# Patient Record
Sex: Female | Born: 1990 | Race: White | Hispanic: No | Marital: Married | Smoking: Never smoker
Health system: Southern US, Community
[De-identification: ages and names within clinical notes are randomized; demographics above are authoritative.]

## PROBLEM LIST (undated history)

## (undated) DIAGNOSIS — N898 Other specified noninflammatory disorders of vagina: Secondary | ICD-10-CM

## (undated) DIAGNOSIS — B379 Candidiasis, unspecified: Secondary | ICD-10-CM

## (undated) DIAGNOSIS — N63 Unspecified lump in unspecified breast: Secondary | ICD-10-CM

## (undated) DIAGNOSIS — R42 Dizziness and giddiness: Secondary | ICD-10-CM

## (undated) DIAGNOSIS — F329 Major depressive disorder, single episode, unspecified: Secondary | ICD-10-CM

## (undated) DIAGNOSIS — F32A Depression, unspecified: Secondary | ICD-10-CM

## (undated) DIAGNOSIS — Z309 Encounter for contraceptive management, unspecified: Secondary | ICD-10-CM

## (undated) DIAGNOSIS — R000 Tachycardia, unspecified: Secondary | ICD-10-CM

## (undated) HISTORY — DX: Unspecified lump in unspecified breast: N63.0

## (undated) HISTORY — DX: Dizziness and giddiness: R42

## (undated) HISTORY — DX: Major depressive disorder, single episode, unspecified: F32.9

## (undated) HISTORY — DX: Depression, unspecified: F32.A

## (undated) HISTORY — DX: Tachycardia, unspecified: R00.0

## (undated) HISTORY — DX: Encounter for contraceptive management, unspecified: Z30.9

## (undated) HISTORY — PX: WISDOM TOOTH EXTRACTION: SHX21

## (undated) HISTORY — PX: TONSILLECTOMY AND ADENOIDECTOMY: SHX28

## (undated) HISTORY — DX: Candidiasis, unspecified: B37.9

## (undated) HISTORY — DX: Other specified noninflammatory disorders of vagina: N89.8

---
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
## 2013-06-25 ENCOUNTER — Encounter: Payer: Self-pay | Admitting: Women's Health

## 2013-06-25 ENCOUNTER — Encounter (INDEPENDENT_AMBULATORY_CARE_PROVIDER_SITE_OTHER): Payer: Self-pay

## 2013-06-25 ENCOUNTER — Ambulatory Visit (INDEPENDENT_AMBULATORY_CARE_PROVIDER_SITE_OTHER): Payer: BC Managed Care – PPO | Admitting: Women's Health

## 2013-06-25 VITALS — BP 102/80 | Ht 62.0 in | Wt 108.0 lb

## 2013-06-25 DIAGNOSIS — N926 Irregular menstruation, unspecified: Secondary | ICD-10-CM

## 2013-06-25 NOTE — Patient Instructions (Signed)

## 2013-06-26 LAB — GC/CHLAMYDIA PROBE AMP
CT Probe RNA: NEGATIVE
GC Probe RNA: NEGATIVE

## 2013-06-26 NOTE — Progress Notes (Signed)
Patient ID: GLENDELL SCHLOTTMAN, female   DOB: 01-Dec-1990, 22 y.o.   MRN: [id number] Gina Galvan is a 22 y.o. G0 Caucasian female here w/ report of irregular bleeding and cramping on COCs. She reports being on continuous regimen Sprintec x 46yr d/t heavy periods, and occasionally will have some BTB, so she will stop pills for few days, then start back.  However, last month she had period x 3 weeks, so she again came off of pills for a few days, bleeding stopped, she got back on pills, and bleeding began again.  She uses condoms for secondary contraception and STI prevention.  Denies h/o thyroid problems.  She has never had a pap smear, but has appt scheduled for 11/22.   O: BP 102/80  Ht 5\' 2"  (1.575 m)  Wt 108 lb (48.988 kg)  BMI 19.75 kg/m2  LMP 06/22/2013     Vulva is normal without lesions    Vagina is pink moist without discharge    Cervix normal in appearance, no polyps    Uterus is normal size shape and contour    Adnexa is negative with normal sized ovaries    A: DUB on continuous regimen COCs     Needs pap    P: TSH, GC/CH today      Discussed w/ JAG     To begin regular regimen COCs/take placebo pills to see if this will help w/ DUB     Continue condoms for STI prevention     F/U as scheduled on 11/22 for pap/physical  Cheral Marker, CNM, [facility] 06/26/2013 9:40 AM

## 2013-07-04 ENCOUNTER — Other Ambulatory Visit: Payer: Self-pay

## 2013-07-19 ENCOUNTER — Encounter: Payer: Self-pay | Admitting: Adult Health

## 2013-07-19 ENCOUNTER — Other Ambulatory Visit (HOSPITAL_COMMUNITY)
Admission: RE | Admit: 2013-07-19 | Discharge: 2013-07-19 | Disposition: A | Discharge status: Home / Self Care | Payer: BC Managed Care – PPO | Source: Ambulatory Visit | Attending: Obstetrics and Gynecology | Admitting: Obstetrics and Gynecology

## 2013-07-19 ENCOUNTER — Ambulatory Visit (INDEPENDENT_AMBULATORY_CARE_PROVIDER_SITE_OTHER): Payer: BC Managed Care – PPO | Admitting: Adult Health

## 2013-07-19 VITALS — BP 110/60 | HR 76 | Ht 61.5 in | Wt 106.5 lb

## 2013-07-19 DIAGNOSIS — Z01419 Encounter for gynecological examination (general) (routine) without abnormal findings: Secondary | ICD-10-CM

## 2013-07-19 DIAGNOSIS — N63 Unspecified lump in unspecified breast: Secondary | ICD-10-CM

## 2013-07-19 DIAGNOSIS — Z3041 Encounter for surveillance of contraceptive pills: Secondary | ICD-10-CM | POA: Insufficient documentation

## 2013-07-19 DIAGNOSIS — Z309 Encounter for contraceptive management, unspecified: Secondary | ICD-10-CM

## 2013-07-19 HISTORY — DX: Unspecified lump in unspecified breast: N63.0

## 2013-07-19 HISTORY — DX: Encounter for contraceptive management, unspecified: Z30.9

## 2013-07-19 MED ORDER — NORGESTIMATE-ETH ESTRADIOL 0.25-35 MG-MCG PO TABS: 1.0000 | ORAL_TABLET | Freq: Every day | ORAL | Status: DC

## 2013-07-19 NOTE — Progress Notes (Signed)
Patient ID: Gina Galvan, female   DOB: Nov 07, 1990, 22 y.o.   MRN: [id number] History of Present Illness: Gina Galvan is a 22 year old white female, single, in for first pap and physical.Has noticed nodule left breast x 1 month, it is mobile and non tender.   Current Medications, Allergies, Past Medical History, Past Surgical History, Family History and Social History were reviewed in [employer] record.     Review of Systems: Patient denies any headaches, blurred vision, shortness of breath, chest pain, abdominal pain, problems with bowel movements, urination, or intercourse. No joint pain or mod swings, in nursing school.Had some bleeding issues but they have resolved,See HPI.    Physical Exam:BP 110/60  Pulse 76  Ht 5' 1.5" (1.562 m)  Wt 106 lb 8 oz (48.308 kg)  BMI 19.80 kg/m2  LMP 06/22/2013 General:  Well developed, well nourished, no acute distress Skin:  Warm and dry Neck:  Midline trachea, normal thyroid Lungs; Clear to auscultation bilaterally Breast:  No dominant palpable mass, retraction, or nipple discharge on right on left there is a mobile round nodule at 4 0' clock 2 FB from nipple, non tender, no retraction or nipple discharge.?fibroadenoma vs cyst Cardiovascular: Regular rate and rhythm Abdomen:  Soft, non tender, no hepatosplenomegaly Pelvic:  External genitalia is normal in appearance.  The vagina is normal in appearance.               The cervix is everted at os and pap performed with thin prep.  Uterus is felt to be normal size, shape, and contour.  No  adnexal masses or tenderness noted. Extremities:  No swelling or varicosities noted Psych:  No mood changes, alert and cooperative seems happy   Impression: Yearly gyn exam Contraceptive management Left breast nodule    Plan: [place] left breast 11/26 at 9 am at [facility] Physical in 1 year, pap in 2 years Refilled sprintec x 1 year

## 2013-07-19 NOTE — Patient Instructions (Signed)
Physical in 1 year Get left breast US 11/26 at 9 am at [facility]

## 2013-07-24 ENCOUNTER — Ambulatory Visit (HOSPITAL_COMMUNITY)
Admission: RE | Admit: 2013-07-24 | Discharge: 2013-07-24 | Disposition: A | Discharge status: Home / Self Care | Payer: BC Managed Care – PPO | Source: Ambulatory Visit | Attending: Adult Health | Admitting: Adult Health

## 2013-07-24 ENCOUNTER — Ambulatory Visit (HOSPITAL_COMMUNITY): Payer: BC Managed Care – PPO

## 2013-07-24 DIAGNOSIS — N63 Unspecified lump in unspecified breast: Secondary | ICD-10-CM

## 2014-02-23 IMAGING — US US BREAST*L*
1 series · 4 of 4 positions shown · non-contrast
Comparison: Nine

CLINICAL DATA: Patient with palpable lump left breast.

EXAM:
ULTRASOUND LEFT BREAST

[Series 1: us breast*left* · 0.06mm/px · 4 of 4 slices shown]
[im 1/4]
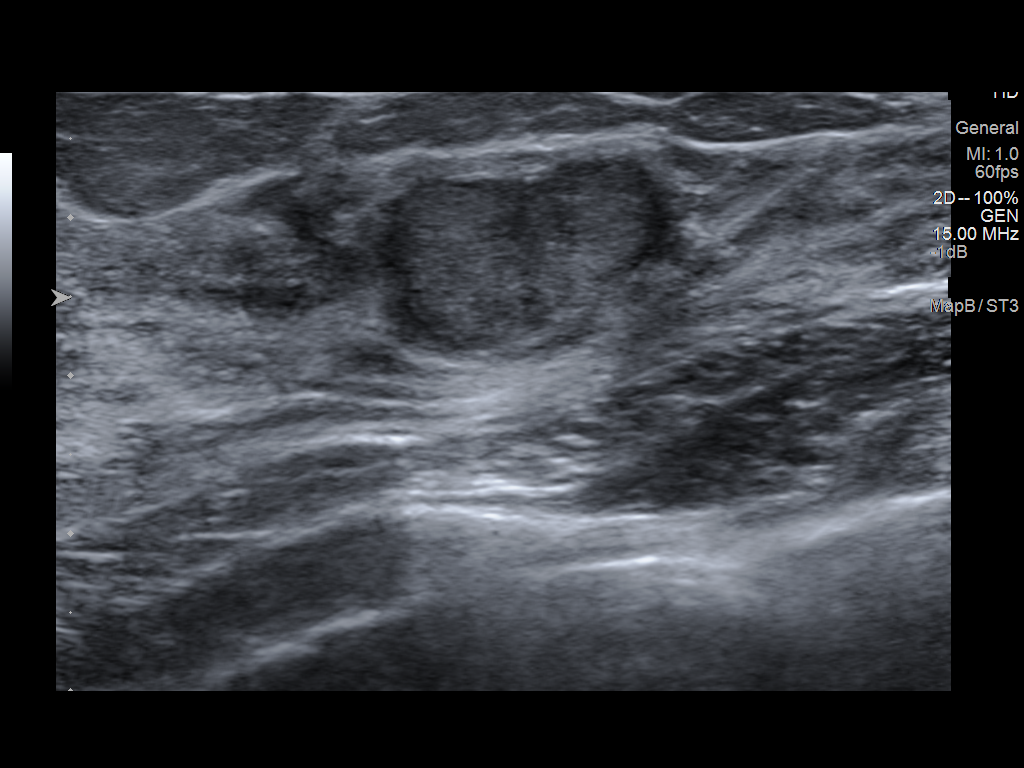
[im 2/4]
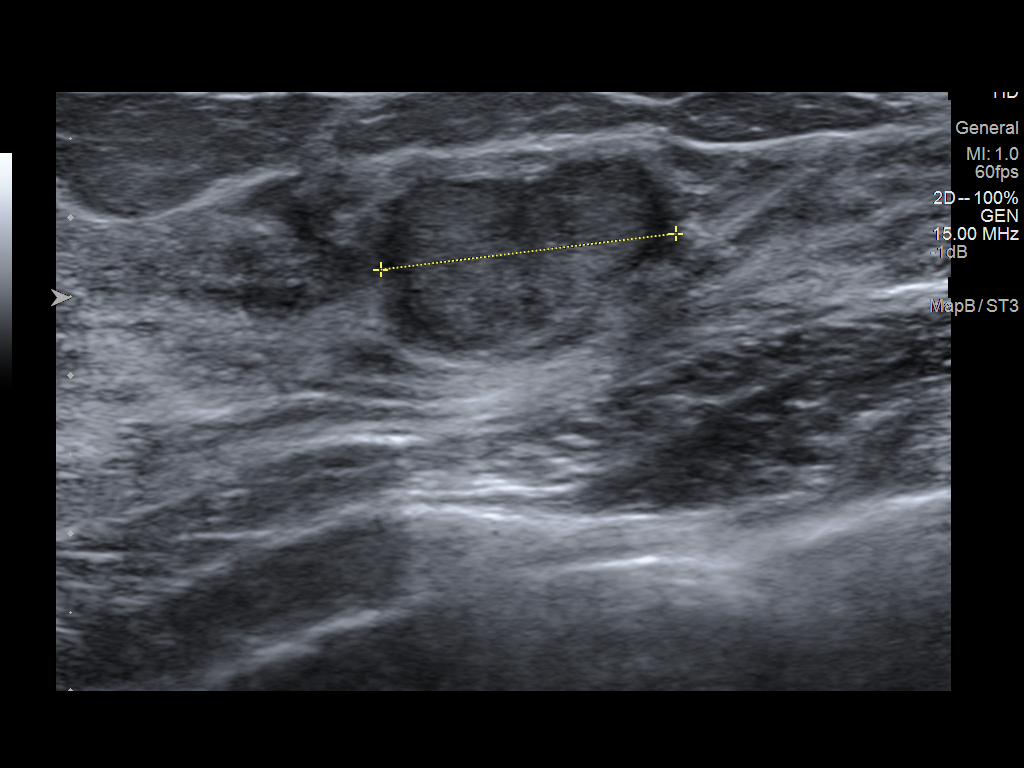
[im 3/4]
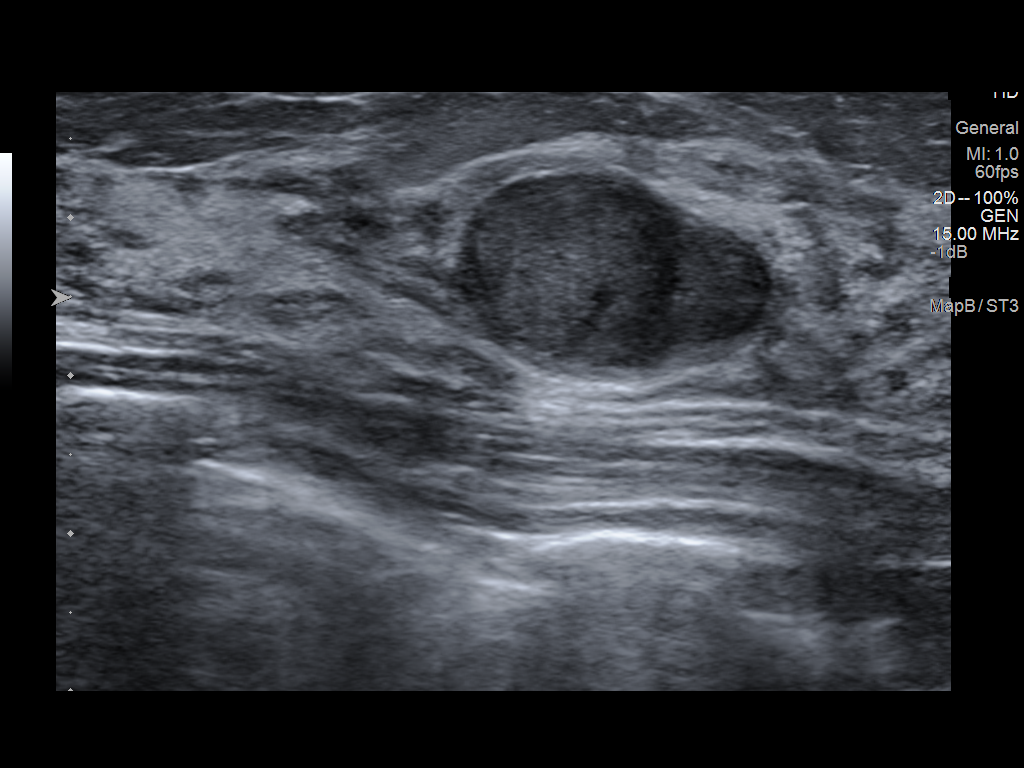
[im 4/4]
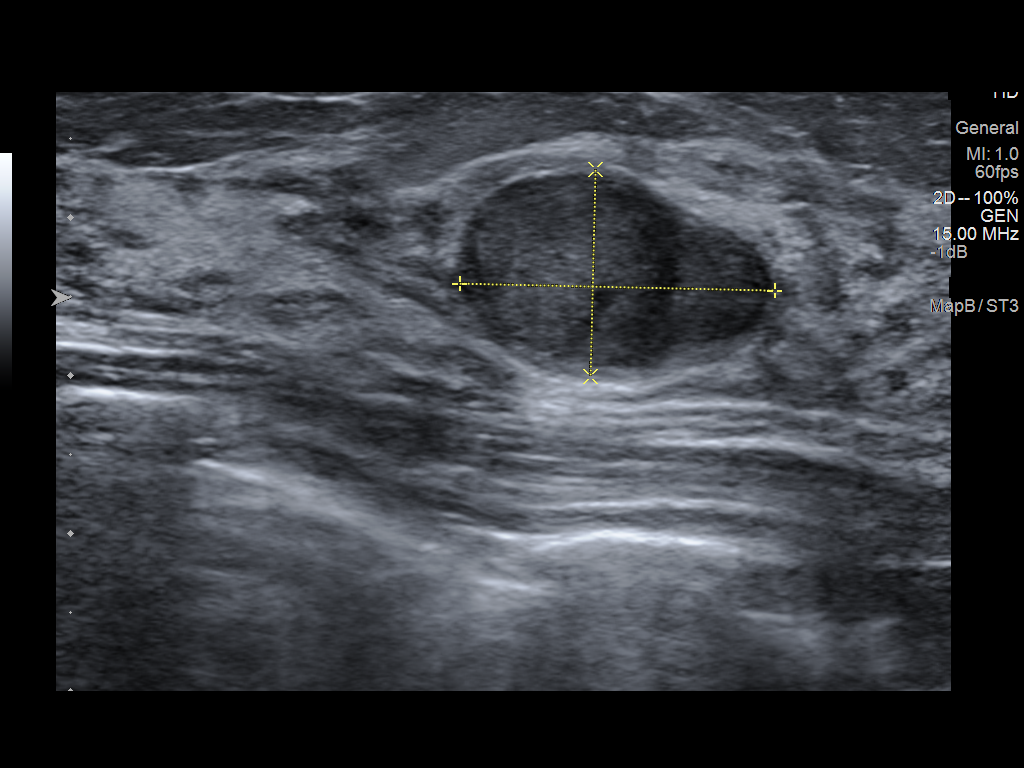

[4 of 4 positions shown; findings below may reference images not displayed]

FINDINGS: On physical exam,I palpate a mobile mass within the lateral left
breast..

Ultrasound is performed, showing a 2.0 x 1.3 x 1.9 cm oval
circumscribed hypoechoic mass within the left breast 3 o'clock
position 2 cm from the nipple at the site of palpable abnormality..
IMPRESSION: Probably benign hypoechoic mass within the left breast at the site
of palpable abnormality, likely representing a fibroadenoma. We
discussed management options including excision, ultrasound-guided
core biopsy, and short term interval follow-up. Follow-up ultrasound
is recommended at 6, 12,and 24 months to assess stability. The
patient concurs with this plan.

RECOMMENDATION:
Left breast ultrasound in 6 months to ensure stability of probably
benign left breast mass.

I have discussed the findings and recommendations with the patient.
Results were also provided in writing at the conclusion of the
visit. If applicable, a reminder letter will be sent to the patient
regarding the next appointment.

BI-RADS CATEGORY  3: Probably benign finding(s) - short interval
follow-up suggested.

## 2014-04-23 ENCOUNTER — Telehealth: Payer: Self-pay | Admitting: Adult Health

## 2014-04-23 MED ORDER — FLUCONAZOLE 150 MG PO TABS: ORAL_TABLET | ORAL | Status: DC

## 2014-04-23 NOTE — Telephone Encounter (Signed)
Requests diflucan ,will rx

## 2014-04-28 ENCOUNTER — Ambulatory Visit: Payer: BC Managed Care – PPO | Admitting: Adult Health

## 2014-06-02 ENCOUNTER — Other Ambulatory Visit: Payer: Self-pay | Admitting: Adult Health

## 2014-06-03 ENCOUNTER — Telehealth: Payer: Self-pay | Admitting: Adult Health

## 2014-06-03 NOTE — Telephone Encounter (Signed)
Spoke with pt. Pt was requesting a refill on birth control pills. Pt has a physical scheduled in November. Wants to discuss maybe changing to a different birth control method at her physical. Our records show the birth control was refilled yesterday. Pt to call pharmacy and see if they have refill. If pharmacy don't have refill, pt will call tomorrow am. JSY

## 2014-06-09 ENCOUNTER — Telehealth: Payer: Self-pay | Admitting: *Deleted

## 2014-06-09 NOTE — Telephone Encounter (Signed)
Left message letting pt know she would need an appt for the yeast infection. Advised to call and schedule an appt. JSY

## 2014-06-10 ENCOUNTER — Encounter: Payer: Self-pay | Admitting: Adult Health

## 2014-06-10 ENCOUNTER — Ambulatory Visit (INDEPENDENT_AMBULATORY_CARE_PROVIDER_SITE_OTHER): Payer: BC Managed Care – PPO | Admitting: Adult Health

## 2014-06-10 VITALS — BP 100/70 | Ht 62.0 in | Wt 112.5 lb

## 2014-06-10 DIAGNOSIS — B379 Candidiasis, unspecified: Secondary | ICD-10-CM

## 2014-06-10 DIAGNOSIS — N898 Other specified noninflammatory disorders of vagina: Secondary | ICD-10-CM

## 2014-06-10 HISTORY — DX: Candidiasis, unspecified: B37.9

## 2014-06-10 HISTORY — DX: Other specified noninflammatory disorders of vagina: N89.8

## 2014-06-10 LAB — POCT WET PREP (WET MOUNT)

## 2014-06-10 MED ORDER — FLUCONAZOLE 150 MG PO TABS: ORAL_TABLET | ORAL | Status: DC

## 2014-06-10 NOTE — Progress Notes (Signed)
Subjective:     Patient ID: Gina Galvan, female   DOB: 03-03-1991, 23 y.o.   MRN: [id number]  HPI Gina Galvan is a 23 year old white female in complaining of vaginal irritation,thinks it is yeast, tried 1 day monistat and got worse, had swelling and irritation.  Review of Systems See HPI Reviewed past medical,surgical, social and family history. Reviewed medications and allergies.      Objective:   Physical Exam BP 100/70  Ht 5\' 2"  (1.575 m)  Wt 112 lb 8 oz (51.03 kg)  BMI 20.57 kg/m2   Skin warm and dry.Pelvic: external genitalia is normal in appearance, vagina: scant white discharge without odor,some redness, cervix is everted at os, uterus: normal size, shape and contour, non tender, no masses felt, adnexa: no masses or tenderness noted. Wet prep: + for yeast and +WBCs.  Assessment:     Vaginal irritation Yeast infection     Plan:     Rx diflucan 150 mg #2 take 1 now and 1 in 3 days with 3 refills Follow up prn Review handout on yeast infections

## 2014-06-10 NOTE — Patient Instructions (Signed)
Monilial Vaginitis Vaginitis in a soreness, swelling and redness (inflammation) of the vagina and vulva. Monilial vaginitis is not a sexually transmitted infection. CAUSES  Yeast vaginitis is caused by yeast (candida) that is normally found in your vagina. With a yeast infection, the candida has overgrown in number to a point that upsets the chemical balance. SYMPTOMS   White, thick vaginal discharge.  Swelling, itching, redness and irritation of the vagina and possibly the lips of the vagina (vulva).  Burning or painful urination.  Painful intercourse. DIAGNOSIS  Things that may contribute to monilial vaginitis are:  Postmenopausal and virginal states.  Pregnancy.  Infections.  Being tired, sick or stressed, especially if you had monilial vaginitis in the past.  Diabetes. Good control will help lower the chance.  Birth control pills.  Tight fitting garments.  Using bubble bath, feminine sprays, douches or deodorant tampons.  Taking certain medications that kill germs (antibiotics).  Sporadic recurrence can occur if you become ill. TREATMENT  Your caregiver will give you medication.  There are several kinds of anti monilial vaginal creams and suppositories specific for monilial vaginitis. For recurrent yeast infections, use a suppository or cream in the vagina 2 times a week, or as directed.  Anti-monilial or steroid cream for the itching or irritation of the vulva may also be used. Get your caregiver's permission.  Painting the vagina with methylene blue solution may help if the monilial cream does not work.  Eating yogurt may help prevent monilial vaginitis. HOME CARE INSTRUCTIONS   Finish all medication as prescribed.  Do not have sex until treatment is completed or after your caregiver tells you it is okay.  Take warm sitz baths.  Do not douche.  Do not use tampons, especially scented ones.  Wear cotton underwear.  Avoid tight pants and panty  hose.  Tell your sexual partner that you have a yeast infection. They should go to their caregiver if they have symptoms such as mild rash or itching.  Your sexual partner should be treated as well if your infection is difficult to eliminate.  Practice safer sex. Use condoms.  Some vaginal medications cause latex condoms to fail. Vaginal medications that harm condoms are:  Cleocin cream.  Butoconazole (Femstat).  Terconazole (Terazol) vaginal suppository.  Miconazole (Monistat) (may be purchased over the counter). SEEK MEDICAL CARE IF:   You have a temperature by mouth above 102 F (38.9 C).  The infection is getting worse after 2 days of treatment.  The infection is not getting better after 3 days of treatment.  You develop blisters in or around your vagina.  You develop vaginal bleeding, and it is not your menstrual period.  You have pain when you urinate.  You develop intestinal problems.  You have pain with sexual intercourse. Document Released: 05/25/2005 Document Revised: 11/07/2011 Document Reviewed: 02/06/2009 [facility] Patient Information 2015 [place], [place]. This information is not intended to replace advice given to you by your health care provider. Make sure you discuss any questions you have with your health care provider. Try diflucan follow up prn

## 2014-07-22 ENCOUNTER — Encounter: Payer: Self-pay | Admitting: Adult Health

## 2014-07-22 ENCOUNTER — Ambulatory Visit (INDEPENDENT_AMBULATORY_CARE_PROVIDER_SITE_OTHER): Payer: BC Managed Care – PPO | Admitting: Adult Health

## 2014-07-22 VITALS — BP 112/58 | HR 76 | Ht 62.0 in | Wt 112.0 lb

## 2014-07-22 DIAGNOSIS — Z01419 Encounter for gynecological examination (general) (routine) without abnormal findings: Secondary | ICD-10-CM

## 2014-07-22 DIAGNOSIS — Z3041 Encounter for surveillance of contraceptive pills: Secondary | ICD-10-CM

## 2014-07-22 DIAGNOSIS — N63 Unspecified lump in unspecified breast: Secondary | ICD-10-CM

## 2014-07-22 MED ORDER — NORGESTIMATE-ETH ESTRADIOL 0.25-35 MG-MCG PO TABS: 1.0000 | ORAL_TABLET | Freq: Every day | ORAL | Status: DC

## 2014-07-22 NOTE — Patient Instructions (Signed)
Left breast US 12/15 at 9 am be there 8:45 Pap and physical  In 1 year Continue OCs

## 2014-07-22 NOTE — Progress Notes (Signed)
Patient ID: Gina Galvan, female   DOB: [birth year], 23 y.o.   MRN: [id number] History of Present Illness: Gina Galvan is a 23 year old white female, single in for physical and get OCs refilled.She had a normal pap 07/19/13.Happy with OCs.Got flu shot at work.   Current Medications, Allergies, Past Medical History, Past Surgical History, Family History and Social History were reviewed in [facility] Link electronic medical record.     Review of Systems: Patient denies any headaches, blurred vision, shortness of breath, chest pain, abdominal pain, problems with bowel movements, or intercourse. No joint pain or mood swings.She does say sometimes feels like not emptying bladder(try to void, stand then sit and void again)    Physical Exam:BP 112/58 mmHg  Pulse 76  Ht 5\' 2"  (1.575 m)  Wt 112 lb (50.803 kg)  BMI 20.48 kg/m2  LMP 07/07/2014 General:  Well developed, well nourished, no acute distress Skin:  Warm and dry Neck:  Midline trachea, normal thyroid Lungs; Clear to auscultation bilaterally Breast:  No dominant palpable mass, retraction, or nipple discharge on right, on left no retraction or nipple discharge but has mobile nodule at 3 0' clock, had US last year and needs F/U US Cardiovascular: Regular rate and rhythm Abdomen:  Soft, non tender, no hepatosplenomegaly Pelvic:  External genitalia is normal in appearance.  The vagina is normal in appearance. The cervix is everted at os..  Uterus is felt to be normal size, shape, and contour.  No  adnexal masses or tenderness noted. Extremities:  No swelling or varicosities noted Psych:  No mood changes,alert and cooperative,seems happy   Impression: Well woman gyn exam no pap Contraceptive management Left breast nodule   Plan: Refilled sprintec x 1 year [facility] in 1 year Try double voiding

## 2014-08-12 ENCOUNTER — Ambulatory Visit (HOSPITAL_COMMUNITY): Payer: BC Managed Care – PPO

## 2015-04-06 ENCOUNTER — Other Ambulatory Visit: Payer: Self-pay | Admitting: Adult Health

## 2015-10-22 ENCOUNTER — Other Ambulatory Visit: Payer: Self-pay | Admitting: Adult Health

## 2016-04-15 ENCOUNTER — Other Ambulatory Visit: Payer: Self-pay | Admitting: Adult Health

## 2016-11-18 ENCOUNTER — Other Ambulatory Visit: Payer: Self-pay | Admitting: Adult Health

## 2017-06-01 ENCOUNTER — Telehealth: Payer: Self-pay | Admitting: Adult Health

## 2017-06-01 MED ORDER — NORGESTIMATE-ETH ESTRADIOL 0.25-35 MG-MCG PO TABS: 1.0000 | ORAL_TABLET | Freq: Every day | ORAL | 0 refills | Status: DC

## 2017-06-01 NOTE — Telephone Encounter (Signed)
Left message that 1 refill called in on OCs, but needs pap and physical before more refills

## 2017-06-02 ENCOUNTER — Telehealth: Payer: Self-pay | Admitting: *Deleted

## 2017-06-02 NOTE — Telephone Encounter (Signed)
Patient states she has changed her last name to Robel. Prescription clarification called to [facility].

## 2017-06-16 ENCOUNTER — Encounter: Payer: Self-pay | Admitting: Adult Health

## 2017-06-16 ENCOUNTER — Other Ambulatory Visit (HOSPITAL_COMMUNITY)
Admission: RE | Admit: 2017-06-16 | Discharge: 2017-06-16 | Disposition: A | Discharge status: Home / Self Care | Payer: BLUE CROSS/BLUE SHIELD | Source: Ambulatory Visit | Attending: Obstetrics & Gynecology | Admitting: Obstetrics & Gynecology

## 2017-06-16 ENCOUNTER — Ambulatory Visit (INDEPENDENT_AMBULATORY_CARE_PROVIDER_SITE_OTHER): Payer: 59 | Admitting: Adult Health

## 2017-06-16 VITALS — BP 108/72 | HR 80 | Ht 62.0 in | Wt 113.0 lb

## 2017-06-16 DIAGNOSIS — Z01419 Encounter for gynecological examination (general) (routine) without abnormal findings: Secondary | ICD-10-CM | POA: Insufficient documentation

## 2017-06-16 DIAGNOSIS — Z3041 Encounter for surveillance of contraceptive pills: Secondary | ICD-10-CM

## 2017-06-16 MED ORDER — NORGESTIMATE-ETH ESTRADIOL 0.25-35 MG-MCG PO TABS: 1.0000 | ORAL_TABLET | Freq: Every day | ORAL | 4 refills | Status: DC

## 2017-06-16 NOTE — Patient Instructions (Signed)
Physical in 1 year Pap in 3 if normal 

## 2017-06-16 NOTE — Progress Notes (Signed)
Patient ID: Gina Galvan, female   DOB: 1991/05/07, 26 y.o.   MRN: [id number] History of Present Illness:  Gina Galvan is a 26 year old white female, married in for a well woman gyn exam and pap.[place] in [employer] [place]  PCP is Dr Dimas AguasHoward.  Current Medications, Allergies, Past Medical History, Past Surgical History, Family History and Social History were reviewed in [facility] Link electronic medical record.     Review of Systems: Patient denies any headaches, hearing loss, fatigue, blurred vision, shortness of breath, chest pain, abdominal pain, problems with bowel movements, urination, or intercourse. No joint pain or mood swings.    Physical Exam:BP 108/72 (BP Location: Left Arm, Patient Position: Sitting, Cuff Size: Normal)   Pulse 80   Ht 5\' 2"  (1.575 m)   Wt 113 lb (51.3 kg)   LMP 06/07/2017   BMI 20.67 kg/m  General:  Well developed, well nourished, no acute distress Skin:  Warm and dry Neck:  Midline trachea, normal thyroid, good ROM, no lymphadenopathy Lungs; Clear to auscultation bilaterally Breast:  No dominant palpable mass, retraction, or nipple discharge Cardiovascular: Regular rate and rhythm Abdomen:  Soft, non tender, no hepatosplenomegaly Pelvic:  External genitalia is normal in appearance, no lesions.  The vagina is normal in appearance. Urethra has no lesions or masses. The cervix is everted at os, pap with reflex HPV performed.  Uterus is felt to be normal size, shape, and contour.  No adnexal masses or tenderness noted.Bladder is non tender, no masses felt. Extremities/musculoskeletal:  No swelling or varicosities noted, no clubbing or cyanosis Psych:  No mood changes, alert and cooperative,seems happy PHQ 2 score 0.  Impression:  1. Encounter for gynecological examination with Papanicolaou smear of cervix   2. Encounter for surveillance of contraceptive pills      Plan:  Refilled sprintec, disp #3 packs, take 1 daily with 4  refills Physical in 1 year Pap in 3 if normal

## 2017-06-19 LAB — CYTOLOGY - PAP: Diagnosis: NEGATIVE

## 2017-06-30 ENCOUNTER — Other Ambulatory Visit: Payer: Self-pay | Admitting: Adult Health

## 2017-09-18 ENCOUNTER — Other Ambulatory Visit: Payer: Self-pay | Admitting: Adult Health

## 2017-09-18 MED ORDER — NORGESTIMATE-ETH ESTRADIOL 0.25-35 MG-MCG PO TABS: 1.0000 | ORAL_TABLET | Freq: Every day | ORAL | 4 refills | Status: DC

## 2017-09-18 NOTE — Progress Notes (Signed)
Refill ocs with 90 day supply

## 2018-08-29 NOTE — L&D Delivery Note (Addendum)
Delivery Note  28 y.o. female G1P0000 with IUP at [redacted]w[redacted]d by LMP who presented for SOL. Intrapartum course remarkable for AROM for meconium stained amniotic fluid and further augmentation with pitocin.  Progressed to second stage and was pushing for over on hours but was crowning for about 30 minutes.  Noted to have a very tight introitus, and needed a small midline episiotomy to be performed for delivery of fetal head. This episiotomy was performed by Dr. Harolyn Rutherford, who was called to the bedside at this point.   Patient had a shoulder dystocia after delivery of the head. Relieved by McRoberts maneuver carried out by Dr Harolyn Rutherford who was present for the delivery..  At 2:24 AM a viable female was delivered via Vaginal, Spontaneous (Presentation: LOA).  APGAR: 8,9 ; weight 8 lb 4.3 oz (3751 g).   Placenta status: spontaneous and intact Cord: 3 vessel cord.    Delivery was complicated by severe postpartum hemorrhage. Lower uterine segment atony noted; fundal area was firm. No retained products palpated on bimanual exam.  She received post placental pitocin, TXA, methergine, hemabate, rectal cytotec. She continued to have brisk bleeding despite these interventions; Code Hemorrhage was activated.   QBL 1815 ml.  Bakri balloon was placed into the uterus, inflated with about 240 ml of saline.   Labs were drawn for CBC and DIC; Hgb was 9.9 from earlier value of 12.7. Patient was feeling lightheaded and presyncopal and received two units of pRBCs rapidly.  Her symptoms were relieved after blood transfusion.    Anesthesia:  Epidural Episiotomy: Median Lacerations: 3rd degree perineal repaired by Dr. Harolyn Rutherford Suture Repair: 0 and 2-0 Vicryl Est. Blood Loss (mL):  9381  Mom to OBSC/postpartum.  Baby to Couplet care / Skin to Skin.  Lattie Haw MD PGY-1, [facility]  05/26/2019, 3:30 AM   Attestation of Attending Supervision of Student:  I confirm that I have verified the information documented in  the medical student's note.  I was present for the delivery, was in charge of the Code Hemorrhage, placed the Bakri balloon and repaired the 3b laceration.  I have verified that all services and findings are accurately documented in this student's note; and I agree with management and plan as outlined in the documentation.    Verita Schneiders, MD, [place] Attending [place], [facility]

## 2018-09-24 ENCOUNTER — Other Ambulatory Visit: Payer: Self-pay

## 2018-09-24 ENCOUNTER — Ambulatory Visit (INDEPENDENT_AMBULATORY_CARE_PROVIDER_SITE_OTHER): Payer: 59 | Admitting: Adult Health

## 2018-09-24 ENCOUNTER — Encounter: Payer: Self-pay | Admitting: Adult Health

## 2018-09-24 VITALS — BP 120/75 | HR 78 | Ht 63.0 in | Wt 121.0 lb

## 2018-09-24 DIAGNOSIS — O26899 Other specified pregnancy related conditions, unspecified trimester: Secondary | ICD-10-CM

## 2018-09-24 DIAGNOSIS — Z3201 Encounter for pregnancy test, result positive: Secondary | ICD-10-CM | POA: Diagnosis not present

## 2018-09-24 DIAGNOSIS — R109 Unspecified abdominal pain: Secondary | ICD-10-CM

## 2018-09-24 DIAGNOSIS — R110 Nausea: Secondary | ICD-10-CM | POA: Diagnosis not present

## 2018-09-24 DIAGNOSIS — N926 Irregular menstruation, unspecified: Secondary | ICD-10-CM | POA: Diagnosis not present

## 2018-09-24 DIAGNOSIS — Z3A01 Less than 8 weeks gestation of pregnancy: Secondary | ICD-10-CM

## 2018-09-24 DIAGNOSIS — O3680X0 Pregnancy with inconclusive fetal viability, not applicable or unspecified: Secondary | ICD-10-CM

## 2018-09-24 LAB — POCT URINE PREGNANCY: Preg Test, Ur: POSITIVE — AB

## 2018-09-24 NOTE — Patient Instructions (Signed)
First Trimester of Pregnancy  The first trimester of pregnancy is from week 1 until the end of week 13 (months 1 through 3). A week after a sperm fertilizes an egg, the egg will implant on the wall of the uterus. This embryo will begin to develop into a baby. Genes from you and your partner will form the baby. The female genes will determine whether the baby will be a boy or a girl. At 6-8 weeks, the eyes and face will be formed, and the heartbeat can be seen on ultrasound. At the end of 12 weeks, all the baby's organs will be formed.  Now that you are pregnant, you will want to do everything you can to have a healthy baby. Two of the most important things are to get good prenatal care and to follow your health care provider's instructions. Prenatal care is all the medical care you receive before the baby's birth. This care will help prevent, find, and treat any problems during the pregnancy and childbirth.  Body changes during your first trimester  Your body goes through many changes during pregnancy. The changes vary from woman to woman.   You may gain or lose a couple of pounds at first.   You may feel sick to your stomach (nauseous) and you may throw up (vomit). If the vomiting is uncontrollable, call your health care provider.   You may tire easily.   You may develop headaches that can be relieved by medicines. All medicines should be approved by your health care provider.   You may urinate more often. Painful urination may mean you have a bladder infection.   You may develop heartburn as a result of your pregnancy.   You may develop constipation because certain hormones are causing the muscles that push stool through your intestines to slow down.   You may develop hemorrhoids or swollen veins (varicose veins).   Your breasts may begin to grow larger and become tender. Your nipples may stick out more, and the tissue that surrounds them (areola) may become darker.   Your gums may bleed and may be  sensitive to brushing and flossing.   Dark spots or blotches (chloasma, mask of pregnancy) may develop on your face. This will likely fade after the baby is born.   Your menstrual periods will stop.   You may have a loss of appetite.   You may develop cravings for certain kinds of food.   You may have changes in your emotions from day to day, such as being excited to be pregnant or being concerned that something may go wrong with the pregnancy and baby.   You may have more vivid and strange dreams.   You may have changes in your hair. These can include thickening of your hair, rapid growth, and changes in texture. Some women also have hair loss during or after pregnancy, or hair that feels dry or thin. Your hair will most likely return to normal after your baby is born.  What to expect at prenatal visits  During a routine prenatal visit:   You will be weighed to make sure you and the baby are growing normally.   Your blood pressure will be taken.   Your abdomen will be measured to track your baby's growth.   The fetal heartbeat will be listened to between weeks 10 and 14 of your pregnancy.   Test results from any previous visits will be discussed.  Your health care provider may ask you:     How you are feeling.   If you are feeling the baby move.   If you have had any abnormal symptoms, such as leaking fluid, bleeding, severe headaches, or abdominal cramping.   If you are using any tobacco products, including cigarettes, chewing tobacco, and electronic cigarettes.   If you have any questions.  Other tests that may be performed during your first trimester include:   Blood tests to find your blood type and to check for the presence of any previous infections. The tests will also be used to check for low iron levels (anemia) and protein on red blood cells (Rh antibodies). Depending on your risk factors, or if you previously had diabetes during pregnancy, you may have tests to check for high blood sugar  that affects pregnant women (gestational diabetes).   Urine tests to check for infections, diabetes, or protein in the urine.   An ultrasound to confirm the proper growth and development of the baby.   Fetal screens for spinal cord problems (spina bifida) and Down syndrome.   HIV (human immunodeficiency virus) testing. Routine prenatal testing includes screening for HIV, unless you choose not to have this test.   You may need other tests to make sure you and the baby are doing well.  Follow these instructions at home:  Medicines   Follow your health care provider's instructions regarding medicine use. Specific medicines may be either safe or unsafe to take during pregnancy.   Take a prenatal vitamin that contains at least 600 micrograms (mcg) of folic acid.   If you develop constipation, try taking a stool softener if your health care provider approves.  Eating and drinking     Eat a balanced diet that includes fresh fruits and vegetables, whole grains, good sources of protein such as meat, eggs, or tofu, and low-fat dairy. Your health care provider will help you determine the amount of weight gain that is right for you.   Avoid raw meat and uncooked cheese. These carry germs that can cause birth defects in the baby.   Eating four or five small meals rather than three large meals a day may help relieve nausea and vomiting. If you start to feel nauseous, eating a few soda crackers can be helpful. Drinking liquids between meals, instead of during meals, also seems to help ease nausea and vomiting.   Limit foods that are high in fat and processed sugars, such as fried and sweet foods.   To prevent constipation:  ? Eat foods that are high in fiber, such as fresh fruits and vegetables, whole grains, and beans.  ? Drink enough fluid to keep your urine clear or pale yellow.  Activity   Exercise only as directed by your health care provider. Most women can continue their usual exercise routine during  pregnancy. Try to exercise for 30 minutes at least 5 days a week. Exercising will help you:  ? Control your weight.  ? Stay in shape.  ? Be prepared for labor and delivery.   Experiencing pain or cramping in the lower abdomen or lower back is a good sign that you should stop exercising. Check with your health care provider before continuing with normal exercises.   Try to avoid standing for long periods of time. Move your legs often if you must stand in one place for a long time.   Avoid heavy lifting.   Wear low-heeled shoes and practice good posture.   You may continue to have sex unless your health care   provider tells you not to.  Relieving pain and discomfort   Wear a good support bra to relieve breast tenderness.   Take warm sitz baths to soothe any pain or discomfort caused by hemorrhoids. Use hemorrhoid cream if your health care provider approves.   Rest with your legs elevated if you have leg cramps or low back pain.   If you develop varicose veins in your legs, wear support hose. Elevate your feet for 15 minutes, 3-4 times a day. Limit salt in your diet.  Prenatal care   Schedule your prenatal visits by the twelfth week of pregnancy. They are usually scheduled monthly at first, then more often in the last 2 months before delivery.   Write down your questions. Take them to your prenatal visits.   Keep all your prenatal visits as told by your health care provider. This is important.  Safety   Wear your seat belt at all times when driving.   Make a list of emergency phone numbers, including numbers for family, friends, the hospital, and police and fire departments.  General instructions   Ask your health care provider for a referral to a local prenatal education class. Begin classes no later than the beginning of month 6 of your pregnancy.   Ask for help if you have counseling or nutritional needs during pregnancy. Your health care provider can offer advice or refer you to specialists for help  with various needs.   Do not use hot tubs, steam rooms, or saunas.   Do not douche or use tampons or scented sanitary pads.   Do not cross your legs for long periods of time.   Avoid cat litter boxes and soil used by cats. These carry germs that can cause birth defects in the baby and possibly loss of the fetus by miscarriage or stillbirth.   Avoid all smoking, herbs, alcohol, and medicines not prescribed by your health care provider. Chemicals in these products affect the formation and growth of the baby.   Do not use any products that contain nicotine or tobacco, such as cigarettes and e-cigarettes. If you need help quitting, ask your health care provider. You may receive counseling support and other resources to help you quit.   Schedule a dentist appointment. At home, brush your teeth with a soft toothbrush and be gentle when you floss.  Contact a health care provider if:   You have dizziness.   You have mild pelvic cramps, pelvic pressure, or nagging pain in the abdominal area.   You have persistent nausea, vomiting, or diarrhea.   You have a bad smelling vaginal discharge.   You have pain when you urinate.   You notice increased swelling in your face, hands, legs, or ankles.   You are exposed to fifth disease or chickenpox.   You are exposed to German measles (rubella) and have never had it.  Get help right away if:   You have a fever.   You are leaking fluid from your vagina.   You have spotting or bleeding from your vagina.   You have severe abdominal cramping or pain.   You have rapid weight gain or loss.   You vomit blood or material that looks like coffee grounds.   You develop a severe headache.   You have shortness of breath.   You have any kind of trauma, such as from a fall or a car accident.  Summary   The first trimester of pregnancy is from week 1 until   the end of week 13 (months 1 through 3).   Your body goes through many changes during pregnancy. The changes vary from  woman to woman.   You will have routine prenatal visits. During those visits, your health care provider will examine you, discuss any test results you may have, and talk with you about how you are feeling.  This information is not intended to replace advice given to you by your health care provider. Make sure you discuss any questions you have with your health care provider.  Document Released: 08/09/2001 Document Revised: 07/27/2016 Document Reviewed: 07/27/2016  [employer]vier Interactive Patient Education  2019 [employer]

## 2018-09-24 NOTE — Progress Notes (Signed)
Patient ID: Gina Galvan, female   DOB: [birth year], 28 y.o.   MRN: [id number] History of Present Illness:  Gina Galvan is a 28 year old white female, married, worked in for [employer].Has missed period and had +HPT and is having cramping left side, and nausea. PCP is Dr Dimas Aguas.   Current Medications, Allergies, Past Medical History, Past Surgical History, Family History and Social History were reviewed in [employer] record.     Review of Systems: +missed period, with +HPT +cramping, left side +nausea Nasal congestion   Physical Exam:BP 120/75 (BP Location: Right Arm, Patient Position: Sitting, Cuff Size: Normal)   Pulse 78   Ht 5\' 3"  (1.6 m)   Wt 121 lb (54.9 kg)   LMP 08/16/2018   BMI 21.43 kg/m   UPT +, about 5+4 weeks by LMP with EDD 05/23/2019. General:  Well developed, well nourished, no acute distress Skin:  Warm and dry Neck:  Midline trachea, normal thyroid, good ROM, no lymphadenopathy Lungs; Clear to auscultation bilaterally Cardiovascular: Regular rate and rhythm Abdomen:  Soft, non tender Psych:  No mood changes, alert and cooperative,seems happy Fall risk is low. 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    Impression: 1. Positive pregnancy test   2. Less than [redacted] weeks gestation of pregnancy   3. Encounter to determine fetal viability of pregnancy, single or unspecified fetus   4. Cramping affecting pregnancy, antepartum   5. Nausea       Plan:  Check QHCG and progesterone Return in 2 weeks for dating US/ 4 weeks for new OB Review handout on First trimester and by [facility] Eat often Push  Fluids  If any bleeding or sever pain go to ER

## 2018-09-25 ENCOUNTER — Telehealth: Payer: Self-pay | Admitting: Adult Health

## 2018-09-25 LAB — BETA HCG QUANT (REF LAB): HCG QUANT: 22191 m[IU]/mL

## 2018-09-25 LAB — PROGESTERONE: Progesterone: 27.6 ng/mL

## 2018-09-25 NOTE — Telephone Encounter (Signed)
Patient called stating that she would like to know the results of her blood work.  °Please contact pt °

## 2018-09-26 NOTE — Telephone Encounter (Signed)
Pt informed of hcg levels.

## 2018-10-10 ENCOUNTER — Ambulatory Visit (INDEPENDENT_AMBULATORY_CARE_PROVIDER_SITE_OTHER): Payer: 59

## 2018-10-10 DIAGNOSIS — O3680X0 Pregnancy with inconclusive fetal viability, not applicable or unspecified: Secondary | ICD-10-CM

## 2018-10-10 NOTE — Progress Notes (Signed)
[place] 7+6 wks,single IUP w/ys,positive fht 164 bpm,normal ovaries bilat,simple left corpus luteal cyst 2.2 x 1.7 x 1.7 cm,crl 17.19 mm

## 2018-10-24 ENCOUNTER — Ambulatory Visit: Payer: 59 | Admitting: *Deleted

## 2018-10-24 ENCOUNTER — Other Ambulatory Visit: Payer: Self-pay

## 2018-10-24 ENCOUNTER — Encounter: Payer: Self-pay | Admitting: Advanced Practice Midwife

## 2018-10-24 ENCOUNTER — Ambulatory Visit (INDEPENDENT_AMBULATORY_CARE_PROVIDER_SITE_OTHER): Payer: 59 | Admitting: Advanced Practice Midwife

## 2018-10-24 VITALS — BP 104/70 | HR 65 | Wt 120.0 lb

## 2018-10-24 DIAGNOSIS — Z3682 Encounter for antenatal screening for nuchal translucency: Secondary | ICD-10-CM

## 2018-10-24 DIAGNOSIS — Z331 Pregnant state, incidental: Secondary | ICD-10-CM

## 2018-10-24 DIAGNOSIS — Z3400 Encounter for supervision of normal first pregnancy, unspecified trimester: Secondary | ICD-10-CM | POA: Insufficient documentation

## 2018-10-24 DIAGNOSIS — Z3401 Encounter for supervision of normal first pregnancy, first trimester: Secondary | ICD-10-CM

## 2018-10-24 DIAGNOSIS — Z3A09 9 weeks gestation of pregnancy: Secondary | ICD-10-CM

## 2018-10-24 DIAGNOSIS — Z1389 Encounter for screening for other disorder: Secondary | ICD-10-CM

## 2018-10-24 LAB — POCT URINALYSIS DIPSTICK OB
GLUCOSE, UA: NEGATIVE
KETONES UA: NEGATIVE
Nitrite, UA: NEGATIVE
POC,PROTEIN,UA: NEGATIVE
RBC UA: NEGATIVE

## 2018-10-24 NOTE — Patient Instructions (Signed)
 First Trimester of Pregnancy The first trimester of pregnancy is from week 1 until the end of week 12 (months 1 through 3). A week after a sperm fertilizes an egg, the egg will implant on the wall of the uterus. This embryo will begin to develop into a baby. Genes from you and your partner are forming the baby. The female genes determine whether the baby is a boy or a girl. At 6-8 weeks, the eyes and face are formed, and the heartbeat can be seen on ultrasound. At the end of 12 weeks, all the baby's organs are formed.  Now that you are pregnant, you will want to do everything you can to have a healthy baby. Two of the most important things are to get good prenatal care and to follow your health care provider's instructions. Prenatal care is all the medical care you receive before the baby's birth. This care will help prevent, find, and treat any problems during the pregnancy and childbirth. BODY CHANGES Your body goes through many changes during pregnancy. The changes vary from woman to woman.   You may gain or lose a couple of pounds at first.  You may feel sick to your stomach (nauseous) and throw up (vomit). If the vomiting is uncontrollable, call your health care provider.  You may tire easily.  You may develop headaches that can be relieved by medicines approved by your health care provider.  You may urinate more often. Painful urination may mean you have a bladder infection.  You may develop heartburn as a result of your pregnancy.  You may develop constipation because certain hormones are causing the muscles that push waste through your intestines to slow down.  You may develop hemorrhoids or swollen, bulging veins (varicose veins).  Your breasts may begin to grow larger and become tender. Your nipples may stick out more, and the tissue that surrounds them (areola) may become darker.  Your gums may bleed and may be sensitive to brushing and flossing.  Dark spots or blotches  (chloasma, mask of pregnancy) may develop on your face. This will likely fade after the baby is born.  Your menstrual periods will stop.  You may have a loss of appetite.  You may develop cravings for certain kinds of food.  You may have changes in your emotions from day to day, such as being excited to be pregnant or being concerned that something may go wrong with the pregnancy and baby.  You may have more vivid and strange dreams.  You may have changes in your hair. These can include thickening of your hair, rapid growth, and changes in texture. Some women also have hair loss during or after pregnancy, or hair that feels dry or thin. Your hair will most likely return to normal after your baby is born. WHAT TO EXPECT AT YOUR PRENATAL VISITS During a routine prenatal visit:  You will be weighed to make sure you and the baby are growing normally.  Your blood pressure will be taken.  Your abdomen will be measured to track your baby's growth.  The fetal heartbeat will be listened to starting around week 10 or 12 of your pregnancy.  Test results from any previous visits will be discussed. Your health care provider may ask you:  How you are feeling.  If you are feeling the baby move.  If you have had any abnormal symptoms, such as leaking fluid, bleeding, severe headaches, or abdominal cramping.  If you have any questions. Other   tests that may be performed during your first trimester include:  Blood tests to find your blood type and to check for the presence of any previous infections. They will also be used to check for low iron levels (anemia) and Rh antibodies. Later in the pregnancy, blood tests for diabetes will be done along with other tests if problems develop.  Urine tests to check for infections, diabetes, or protein in the urine.  An ultrasound to confirm the proper growth and development of the baby.  An amniocentesis to check for possible genetic problems.  Fetal  screens for spina bifida and Down syndrome.  You may need other tests to make sure you and the baby are doing well. HOME CARE INSTRUCTIONS  Medicines  Follow your health care provider's instructions regarding medicine use. Specific medicines may be either safe or unsafe to take during pregnancy.  Take your prenatal vitamins as directed.  If you develop constipation, try taking a stool softener if your health care provider approves. Diet  Eat regular, well-balanced meals. Choose a variety of foods, such as meat or vegetable-based protein, fish, milk and low-fat dairy products, vegetables, fruits, and whole grain breads and cereals. Your health care provider will help you determine the amount of weight gain that is right for you.  Avoid raw meat and uncooked cheese. These carry germs that can cause birth defects in the baby.  Eating four or five small meals rather than three large meals a day may help relieve nausea and vomiting. If you start to feel nauseous, eating a few soda crackers can be helpful. Drinking liquids between meals instead of during meals also seems to help nausea and vomiting.  If you develop constipation, eat more high-fiber foods, such as fresh vegetables or fruit and whole grains. Drink enough fluids to keep your urine clear or pale yellow. Activity and Exercise  Exercise only as directed by your health care provider. Exercising will help you:  Control your weight.  Stay in shape.  Be prepared for labor and delivery.  Experiencing pain or cramping in the lower abdomen or low back is a good sign that you should stop exercising. Check with your health care provider before continuing normal exercises.  Try to avoid standing for long periods of time. Move your legs often if you must stand in one place for a long time.  Avoid heavy lifting.  Wear low-heeled shoes, and practice good posture.  You may continue to have sex unless your health care provider directs you  otherwise. Relief of Pain or Discomfort  Wear a good support bra for breast tenderness.   Take warm sitz baths to soothe any pain or discomfort caused by hemorrhoids. Use hemorrhoid cream if your health care provider approves.   Rest with your legs elevated if you have leg cramps or low back pain.  If you develop varicose veins in your legs, wear support hose. Elevate your feet for 15 minutes, 3-4 times a day. Limit salt in your diet. Prenatal Care  Schedule your prenatal visits by the twelfth week of pregnancy. They are usually scheduled monthly at first, then more often in the last 2 months before delivery.  Write down your questions. Take them to your prenatal visits.  Keep all your prenatal visits as directed by your health care provider. Safety  Wear your seat belt at all times when driving.  Make a list of emergency phone numbers, including numbers for family, friends, the hospital, and police and fire departments. General   Tips  Ask your health care provider for a referral to a local prenatal education class. Begin classes no later than at the beginning of month 6 of your pregnancy.  Ask for help if you have counseling or nutritional needs during pregnancy. Your health care provider can offer advice or refer you to specialists for help with various needs.  Do not use hot tubs, steam rooms, or saunas.  Do not douche or use tampons or scented sanitary pads.  Do not cross your legs for long periods of time.  Avoid cat litter boxes and soil used by cats. These carry germs that can cause birth defects in the baby and possibly loss of the fetus by miscarriage or stillbirth.  Avoid all smoking, herbs, alcohol, and medicines not prescribed by your health care provider. Chemicals in these affect the formation and growth of the baby.  Schedule a dentist appointment. At home, brush your teeth with a soft toothbrush and be gentle when you floss. SEEK MEDICAL CARE IF:   You have  dizziness.  You have mild pelvic cramps, pelvic pressure, or nagging pain in the abdominal area.  You have persistent nausea, vomiting, or diarrhea.  You have a bad smelling vaginal discharge.  You have pain with urination.  You notice increased swelling in your face, hands, legs, or ankles. SEEK IMMEDIATE MEDICAL CARE IF:   You have a fever.  You are leaking fluid from your vagina.  You have spotting or bleeding from your vagina.  You have severe abdominal cramping or pain.  You have rapid weight gain or loss.  You vomit blood or material that looks like coffee grounds.  You are exposed to German measles and have never had them.  You are exposed to fifth disease or chickenpox.  You develop a severe headache.  You have shortness of breath.  You have any kind of trauma, such as from a fall or a car accident. Document Released: 08/09/2001 Document Revised: 12/30/2013 Document Reviewed: 06/25/2013 [employer] Patient Information 2015 [employer]. This information is not intended to replace advice given to you by your health care provider. Make sure you discuss any questions you have with your health care provider.   Nausea & Vomiting  Have saltine crackers or pretzels by your bed and eat a few bites before you raise your head out of bed in the morning  Eat small frequent meals throughout the day instead of large meals  Drink plenty of fluids throughout the day to stay hydrated, just don't drink a lot of fluids with your meals.  This can make your stomach fill up faster making you feel sick  Do not brush your teeth right after you eat  Products with real ginger are good for nausea, like ginger ale and ginger hard candy Make sure it says made with real ginger!  Sucking on sour candy like lemon heads is also good for nausea  If your prenatal vitamins make you nauseated, take them at night so you will sleep through the nausea  Sea Bands  If you feel like you need  medicine for the nausea & vomiting please let us know  If you are unable to keep any fluids or food down please let us know   Constipation  Drink plenty of fluid, preferably water, throughout the day  Eat foods high in fiber such as fruits, vegetables, and grains  Exercise, such as walking, is a good way to keep your bowels regular  Drink warm fluids, especially warm   prune juice, or decaf coffee  Eat a 1/2 cup of real oatmeal (not instant), 1/2 cup applesauce, and 1/2-1 cup warm prune juice every day  If needed, you may take Colace (docusate sodium) stool softener once or twice a day to help keep the stool soft. If you are pregnant, wait until you are out of your first trimester (12-14 weeks of pregnancy)  If you still are having problems with constipation, you may take Miralax once daily as needed to help keep your bowels regular.  If you are pregnant, wait until you are out of your first trimester (12-14 weeks of pregnancy)  Safe Medications in Pregnancy   Acne: Benzoyl Peroxide Salicylic Acid  Backache/Headache: Tylenol: 2 regular strength every 4 hours OR              2 Extra strength every 6 hours  Colds/Coughs/Allergies: Benadryl (alcohol free) 25 mg every 6 hours as needed Breath right strips Claritin Cepacol throat lozenges Chloraseptic throat spray Cold-Eeze- up to three times per day Cough drops, alcohol free Flonase (by prescription only) Guaifenesin Mucinex Robitussin DM (plain only, alcohol free) Saline nasal spray/drops Sudafed (pseudoephedrine) & Actifed ** use only after [redacted] weeks gestation and if you do not have high blood pressure Tylenol Vicks Vaporub Zinc lozenges Zyrtec   Constipation: Colace Ducolax suppositories Fleet enema Glycerin suppositories Metamucil Milk of magnesia Miralax Senokot Smooth move tea  Diarrhea: Kaopectate Imodium A-D  *NO pepto Bismol  Hemorrhoids: Anusol Anusol HC Preparation  H Tucks  Indigestion: Tums Maalox Mylanta Zantac  Pepcid  Insomnia: Benadryl (alcohol free) 25mg every 6 hours as needed Tylenol PM Unisom, no Gelcaps  Leg Cramps: Tums MagGel  Nausea/Vomiting:  Bonine Dramamine Emetrol Ginger extract Sea bands Meclizine  Nausea medication to take during pregnancy:  Unisom (doxylamine succinate 25 mg tablets) Take one tablet daily at bedtime. If symptoms are not adequately controlled, the dose can be increased to a maximum recommended dose of two tablets daily (1/2 tablet in the morning, 1/2 tablet mid-afternoon and one at bedtime). Vitamin B6 100mg tablets. Take one tablet twice a day (up to 200 mg per day).  Skin Rashes: Aveeno products Benadryl cream or 25mg every 6 hours as needed Calamine Lotion 1% cortisone cream  Yeast infection: Gyne-lotrimin 7 Monistat 7   **If taking multiple medications, please check labels to avoid duplicating the same active ingredients **take medication as directed on the label ** Do not exceed 4000 mg of tylenol in 24 hours **Do not take medications that contain aspirin or ibuprofen      

## 2018-10-24 NOTE — Progress Notes (Signed)
INITIAL OBSTETRICAL VISIT Patient name: Gina Galvan MRN [id number]  Date of birth: 1990/11/14 Chief Complaint:   Initial Prenatal Visit  History of Present Illness:   Gina Galvan is a 28 y.o. G56P0000 Caucasian female at [redacted]w[redacted]d by LMP/early [place] with an Estimated Date of Delivery: 05/23/19 being seen today for her initial obstetrical visit.   Her obstetrical history is significant for first pregnancy. 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 .   Today she reports fatigue and nausea; declines meds.  Patient's last menstrual period was 08/16/2018. Last pap 06/16/17. Results were: normal Review of Systems:   Pertinent items are noted in HPI Denies cramping/contractions, leakage of fluid, vaginal bleeding, abnormal vaginal discharge w/ itching/odor/irritation, headaches, visual changes, shortness of breath, chest pain, abdominal pain, severe nausea/vomiting, or problems with urination or bowel movements unless otherwise stated above.  Pertinent History Reviewed:  Reviewed past medical,surgical, social, obstetrical and family history.  Reviewed problem list, medications and allergies. OB History  Gravida Para Term Preterm AB Living  1 0 0 0 0 0  SAB TAB Ectopic Multiple Live Births  0 0 0 0      # Outcome Date GA Lbr Len/2nd Weight Sex Delivery Anes PTL Lv  1 Current            Physical Assessment:   Vitals:   10/24/18 0955  BP: 104/70  Pulse: 65  Weight: 120 lb (54.4 kg)  Body mass index is 21.26 kg/m.       Physical Examination:  General appearance - well appearing, and in no distress  Mental status - alert, oriented to person, place, and time  Psych:  She has a normal mood and affect  Skin - warm and dry, normal color, no suspicious lesions noted  Chest - effort normal, all lung fields clear to auscultation bilaterally  Heart - normal rate and regular rhythm  Abdomen - soft, nontender  Extremities:  No swelling or varicosities noted   via [place]  Results for orders placed or  performed in visit on 10/24/18 (from the past 24 hour(s))  POC Urinalysis Dipstick OB   Collection Time: 10/24/18 10:27 AM  Result Value Ref Range   Color, UA     Clarity, UA     Glucose, UA Negative Negative   Bilirubin, UA     Ketones, UA neg    Spec Grav, UA     Blood, UA neg    pH, UA     POC,PROTEIN,UA Negative Negative, Trace, Small (1+), Moderate (2+), Large (3+), 4+   Urobilinogen, UA     Nitrite, UA neg    Leukocytes, UA Trace (A) Negative   Appearance     Odor      Assessment & Plan:  1) Low-Risk Pregnancy G1P0000 at [redacted]w[redacted]d with an Estimated Date of Delivery: 05/23/19   2) Initial OB visit  3) wants optimized scheduling  Meds: No orders of the defined types were placed in this encounter.   Initial labs obtained Continue prenatal vitamins Reviewed n/v relief measures and warning s/s to report Reviewed recommended weight gain based on pre-gravid BMI Encouraged well-balanced diet Genetic Screening discussed Mat 21/NT/IT: requested Watched video for carrier screening: Cystic fibrosis screening declined SMA screening declined Fragile X screening declined Ultrasound discussed; fetal survey: requested CCNC completed  Follow-up: Return in about 3 weeks (around 11/14/2018) for US:NT+1st IT, LROB.   Orders Placed This Encounter  Procedures  . GC/Chlamydia Probe Amp  . Urine Culture  . US  Fetal Nuchal Translucency Measurement  . Obstetric Panel, Including HIV  . Urinalysis, Routine w reflex microscopic  . Sickle cell screen  . Pain Management Screening Profile (10S)  . POC Urinalysis Dipstick OB    Jacklyn Shell DNP, CNM 10/24/2018 10:51 AM

## 2018-10-25 LAB — PMP SCREEN PROFILE (10S), URINE
Amphetamine Scrn, Ur: NEGATIVE ng/mL
BARBITURATE SCREEN URINE: NEGATIVE ng/mL
BENZODIAZEPINE SCREEN, URINE: NEGATIVE ng/mL
CANNABINOIDS UR QL SCN: NEGATIVE ng/mL
Cocaine (Metab) Scrn, Ur: NEGATIVE ng/mL
Creatinine(Crt), U: 27.6 mg/dL (ref 20.0–300.0)
METHADONE SCREEN, URINE: NEGATIVE ng/mL
OXYCODONE+OXYMORPHONE UR QL SCN: NEGATIVE ng/mL
Opiate Scrn, Ur: NEGATIVE ng/mL
Ph of Urine: 7.1 (ref 4.5–8.9)
Phencyclidine Qn, Ur: NEGATIVE ng/mL
Propoxyphene Scrn, Ur: NEGATIVE ng/mL

## 2018-10-25 LAB — MED LIST OPTION NOT SELECTED

## 2018-10-26 LAB — URINALYSIS, ROUTINE W REFLEX MICROSCOPIC
BILIRUBIN UA: NEGATIVE
GLUCOSE, UA: NEGATIVE
Ketones, UA: NEGATIVE
Nitrite, UA: NEGATIVE
PROTEIN UA: NEGATIVE
RBC UA: NEGATIVE
SPEC GRAV UA: 1.007 (ref 1.005–1.030)
Urobilinogen, Ur: 0.2 mg/dL (ref 0.2–1.0)
pH, UA: 7.5 (ref 5.0–7.5)

## 2018-10-26 LAB — MICROSCOPIC EXAMINATION
Bacteria, UA: NONE SEEN
CASTS: NONE SEEN /LPF

## 2018-10-26 LAB — OBSTETRIC PANEL, INCLUDING HIV
Antibody Screen: NEGATIVE
Basophils Absolute: 0.1 10*3/uL (ref 0.0–0.2)
Basos: 1 %
EOS (ABSOLUTE): 0 10*3/uL (ref 0.0–0.4)
Eos: 0 %
HIV SCREEN 4TH GENERATION: NONREACTIVE
Hematocrit: 40 % (ref 34.0–46.6)
Hemoglobin: 13.6 g/dL (ref 11.1–15.9)
Hepatitis B Surface Ag: NEGATIVE
Immature Grans (Abs): 0 10*3/uL (ref 0.0–0.1)
Immature Granulocytes: 0 %
Lymphocytes Absolute: 1.9 10*3/uL (ref 0.7–3.1)
Lymphs: 21 %
MCH: 29.2 pg (ref 26.6–33.0)
MCHC: 34 g/dL (ref 31.5–35.7)
MCV: 86 fL (ref 79–97)
MONOCYTES: 4 %
Monocytes Absolute: 0.3 10*3/uL (ref 0.1–0.9)
NEUTROS ABS: 6.5 10*3/uL (ref 1.4–7.0)
Neutrophils: 74 %
PLATELETS: 269 10*3/uL (ref 150–450)
RBC: 4.66 x10E6/uL (ref 3.77–5.28)
RDW: 12.9 % (ref 11.7–15.4)
RPR Ser Ql: NONREACTIVE
RUBELLA: 6.57 {index} (ref >0.99)
Rh Factor: NEGATIVE
WBC: 8.9 10*3/uL (ref 3.4–10.8)

## 2018-10-26 LAB — SICKLE CELL SCREEN: Sickle Cell Screen: NEGATIVE

## 2018-10-27 LAB — GC/CHLAMYDIA PROBE AMP
Chlamydia trachomatis, NAA: NEGATIVE
NEISSERIA GONORRHOEAE BY PCR: NEGATIVE

## 2018-10-28 LAB — URINE CULTURE

## 2018-10-31 ENCOUNTER — Other Ambulatory Visit: Payer: Self-pay | Admitting: Advanced Practice Midwife

## 2018-10-31 DIAGNOSIS — R8271 Bacteriuria: Secondary | ICD-10-CM

## 2018-10-31 MED ORDER — AMPICILLIN 500 MG PO CAPS: 500.0000 mg | ORAL_CAPSULE | Freq: Three times a day (TID) | ORAL | 0 refills | Status: DC

## 2018-11-04 ENCOUNTER — Encounter (HOSPITAL_COMMUNITY): Payer: Self-pay

## 2018-11-07 ENCOUNTER — Telehealth: Payer: Self-pay | Admitting: *Deleted

## 2018-11-07 NOTE — Telephone Encounter (Signed)
Patient states she has talked with her insurance and they will not cover genetic testing unless she is high risk.  Discussed with patient who has decided to cancel u/s appt for NT/IT.  Also has not received BP cuff from [facility] but she did have to change address fro [address] to physical address after speaking with them.  Will give them a little longer to ship.

## 2018-11-12 ENCOUNTER — Other Ambulatory Visit: Payer: 59

## 2018-11-16 ENCOUNTER — Other Ambulatory Visit: Payer: Self-pay

## 2018-11-16 ENCOUNTER — Ambulatory Visit (INDEPENDENT_AMBULATORY_CARE_PROVIDER_SITE_OTHER): Payer: 59 | Admitting: Obstetrics & Gynecology

## 2018-11-16 VITALS — BP 105/67 | HR 69 | Wt 120.0 lb

## 2018-11-16 DIAGNOSIS — Z331 Pregnant state, incidental: Secondary | ICD-10-CM

## 2018-11-16 DIAGNOSIS — Z3A13 13 weeks gestation of pregnancy: Secondary | ICD-10-CM

## 2018-11-16 DIAGNOSIS — Z3401 Encounter for supervision of normal first pregnancy, first trimester: Secondary | ICD-10-CM

## 2018-11-16 DIAGNOSIS — Z1389 Encounter for screening for other disorder: Secondary | ICD-10-CM

## 2018-11-16 NOTE — Progress Notes (Signed)
   LOW-RISK PREGNANCY VISIT Patient name: Gina Galvan MRN [id number]  Date of birth: 12-25-1990 Chief Complaint:   Routine Prenatal Visit  History of Present Illness:   Gina Galvan is a 28 y.o. G74P0000 female at [redacted]w[redacted]d with an Estimated Date of Delivery: 05/23/19 being seen today for ongoing management of a low-risk pregnancy.  Today she reports no complaints. Contractions: Not present. Vag. Bleeding: None.  Movement: Absent. denies leaking of fluid. Review of Systems:   Pertinent items are noted in HPI Denies abnormal vaginal discharge w/ itching/odor/irritation, headaches, visual changes, shortness of breath, chest pain, abdominal pain, severe nausea/vomiting, or problems with urination or bowel movements unless otherwise stated above. Pertinent History Reviewed:  Reviewed past medical,surgical, social, obstetrical and family history.  Reviewed problem list, medications and allergies. Physical Assessment:   Vitals:   11/16/18 1007  BP: 105/67  Pulse: 69  Weight: 120 lb (54.4 kg)  Body mass index is 21.26 kg/m.        Physical Examination:   General appearance: Well appearing, and in no distress  Mental status: Alert, oriented to person, place, and time  Skin: Warm & dry  Cardiovascular: Normal heart rate noted  Respiratory: Normal respiratory effort, no distress  Abdomen: Soft, gravid, nontender  Pelvic: Cervical exam deferred         Extremities: Edema: None  Fetal Status: Fetal Heart Rate (bpm): 147   Movement: Absent    No results found for this or any previous visit (from the past 24 hour(s)).  Assessment & Plan:  1) Low-risk pregnancy G1P0000 at [redacted]w[redacted]d with an Estimated Date of Delivery: 05/23/19   2) No genetic testing per insurance   Meds: No orders of the defined types were placed in this encounter.  Labs/procedures today:   Plan:  Continue routine obstetrical care   Reviewed:  labor symptoms and general obstetric precautions including but not limited  to vaginal bleeding, contractions, leaking of fluid and fetal movement were reviewed in detail with the patient.  All questions were answered  Follow-up: Return in about 5 weeks (around 12/21/2018) for 20 week sono, LROB.  Orders Placed This Encounter  Procedures  . US OB Comp + 14 Wk  . POC Urinalysis Dipstick OB   Lazaro Arms 11/16/2018 10:38 AM

## 2018-12-18 ENCOUNTER — Telehealth: Payer: Self-pay | Admitting: *Deleted

## 2018-12-18 NOTE — Telephone Encounter (Signed)
Called patient and left message informing her that we are not allowing any visitors or children to come to visit with her at this time. Also requested that if she has had any exposure to anyone suspected or confirmed of having COVID-19 to call to reschedule appointment. Also requested that if she is experiencing any of the following to call and reschedule : fever, cough, sob, muscle pain, diarrhea, Dov Dill, vomiting, abdominal pain, red eye, weakness, bruising or bleeding, joint pain, or a severe headache.  

## 2018-12-19 ENCOUNTER — Ambulatory Visit (INDEPENDENT_AMBULATORY_CARE_PROVIDER_SITE_OTHER): Payer: 59 | Admitting: Obstetrics and Gynecology

## 2018-12-19 ENCOUNTER — Other Ambulatory Visit: Payer: Self-pay

## 2018-12-19 ENCOUNTER — Other Ambulatory Visit: Payer: Self-pay | Admitting: Obstetrics & Gynecology

## 2018-12-19 ENCOUNTER — Ambulatory Visit (INDEPENDENT_AMBULATORY_CARE_PROVIDER_SITE_OTHER): Payer: 59

## 2018-12-19 ENCOUNTER — Encounter: Payer: Self-pay | Admitting: Obstetrics and Gynecology

## 2018-12-19 VITALS — BP 99/66 | HR 60 | Temp 98.6°F | Wt 125.0 lb

## 2018-12-19 DIAGNOSIS — Z3A17 17 weeks gestation of pregnancy: Secondary | ICD-10-CM | POA: Diagnosis not present

## 2018-12-19 DIAGNOSIS — Z3402 Encounter for supervision of normal first pregnancy, second trimester: Secondary | ICD-10-CM

## 2018-12-19 DIAGNOSIS — Z1389 Encounter for screening for other disorder: Secondary | ICD-10-CM

## 2018-12-19 DIAGNOSIS — Z363 Encounter for antenatal screening for malformations: Secondary | ICD-10-CM

## 2018-12-19 DIAGNOSIS — Z331 Pregnant state, incidental: Secondary | ICD-10-CM

## 2018-12-19 DIAGNOSIS — R8271 Bacteriuria: Secondary | ICD-10-CM

## 2018-12-19 DIAGNOSIS — Z3401 Encounter for supervision of normal first pregnancy, first trimester: Secondary | ICD-10-CM

## 2018-12-19 LAB — POCT URINALYSIS DIPSTICK OB
Blood, UA: NEGATIVE
Glucose, UA: NEGATIVE
Ketones, UA: NEGATIVE
Leukocytes, UA: NEGATIVE
Nitrite, UA: NEGATIVE
POC,PROTEIN,UA: NEGATIVE

## 2018-12-19 NOTE — Progress Notes (Signed)
[place] 17+6 wks,cx 3.7 cm,posterior marginal placenta gr 0,svp of fluid 4 cm,normal ovaries bilat,efw 234 g 72%,anatomy complete,discussed results w/Dr Emelda Fear

## 2018-12-19 NOTE — Progress Notes (Signed)
Patient ID: [name], female   DOB: [birth year], 28 y.o.   MRN: [id number]    LOW-RISK PREGNANCY VISIT Patient name: Gina Galvan MRN [id number]  Date of birth: [birth year] Chief Complaint:   Routine Prenatal Visit (u/s today- webex)  History of Present Illness:   NALAYAH Galvan is a 28 y.o. G76P0000 female at [redacted]w[redacted]d with an Estimated Date of Delivery: 05/23/19 being seen today for ongoing management of a low-risk pregnancy. Has bad sinuses. Question of xyzal morning and zyrtec at night. 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 Contractions: Not present.  .  Movement: Absent. denies leaking of fluid. Review of Systems:   Pertinent items are noted in HPI Denies abnormal vaginal discharge w/ itching/odor/irritation, headaches, visual changes, shortness of breath, chest pain, abdominal pain, severe nausea/vomiting, or problems with urination or bowel movements unless otherwise stated above. Pertinent History Reviewed:  Reviewed past medical,surgical, social, obstetrical and family history.  Reviewed problem list, medications and allergies. Physical Assessment:   Vitals:   12/19/18 1013  BP: 99/66  Pulse: 60  Temp: 98.6 F (37 C)  Weight: 125 lb (56.7 kg)  Body mass index is 22.14 kg/m.        Physical Examination:   General appearance: Well appearing, and in no distress  Mental status: Alert, oriented to person, place, and time  Skin: Warm & dry  Cardiovascular: Normal heart rate noted  Respiratory: Normal respiratory effort, no distress  Abdomen: Soft, gravid, nontender  Pelvic: Cervical exam deferred         Extremities: Edema: None  Fetal Status:     Movement: Absent    Results for orders placed or performed in visit on 12/19/18 (from the past 24 hour(s))  POC Urinalysis Dipstick OB   Collection Time: 12/19/18 10:23 AM  Result Value Ref Range   Color, UA     Clarity, UA     Glucose, UA Negative Negative   Bilirubin, UA     Ketones, UA neg    Spec Grav, UA     Blood, UA neg    pH, UA     POC,PROTEIN,UA Negative Negative, Trace, Small (1+), Moderate (2+), Large (3+), 4+   Urobilinogen, UA     Nitrite, UA neg    Leukocytes, UA Negative Negative   Appearance     Odor      Assessment & Plan:  1) Low-risk pregnancy G1P0000 at [redacted]w[redacted]d with an Estimated Date of Delivery: 05/23/19   2) posterior low lying placenta  Meds: No orders of the defined types were placed in this encounter.  Labs/procedures today: u/s: anatomy  Plan:  Continue routine obstetrical care  F/u in 4 weeks televisit 28 week anatomy and repeat ultrasound Discussed limitations of sex Discussed limited COVID-19 patient contact in emergency room, double mask  Follow-up: No follow-ups on file.  Orders Placed This Encounter  Procedures  . POC Urinalysis Dipstick OB   By signing my name below, I, Arnette Norris, attest that this documentation has been prepared under the direction and in the presence of Tilda Burrow, MD. Electronically Signed: Arnette Norris Medical Scribe. 12/19/18. 10:24 AM.  I personally performed the services described in this documentation, which was SCRIBED in my presence. The recorded information has been reviewed and considered accurate. It has been edited as necessary during review. Tilda Burrow, MD

## 2019-01-03 ENCOUNTER — Telehealth: Payer: Self-pay | Admitting: Obstetrics & Gynecology

## 2019-01-03 ENCOUNTER — Telehealth: Payer: Self-pay | Admitting: *Deleted

## 2019-01-03 NOTE — Telephone Encounter (Signed)
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  He has shingles and her mother touched a part of the rash.  Informed patient that the incubation period is 10-20 days.  Advised to avoid her mother if possible during this time but if she started to develop symptoms, to let us know.

## 2019-01-03 NOTE — Telephone Encounter (Signed)
Patient called, she is 20 weeks today.  Her mom thinks she has been exposed to shingles.  Is there an incubation period for her to be around her mom?  Mom is not showing any symptoms per the pt.  [phone]

## 2019-01-16 ENCOUNTER — Other Ambulatory Visit: Payer: Self-pay

## 2019-01-16 ENCOUNTER — Encounter: Payer: Self-pay | Admitting: Obstetrics and Gynecology

## 2019-01-16 ENCOUNTER — Ambulatory Visit (INDEPENDENT_AMBULATORY_CARE_PROVIDER_SITE_OTHER): Payer: 59 | Admitting: Obstetrics and Gynecology

## 2019-01-16 VITALS — BP 100/62 | Wt 130.0 lb

## 2019-01-16 DIAGNOSIS — Z3A21 21 weeks gestation of pregnancy: Secondary | ICD-10-CM

## 2019-01-16 DIAGNOSIS — Z3402 Encounter for supervision of normal first pregnancy, second trimester: Secondary | ICD-10-CM

## 2019-01-16 NOTE — Progress Notes (Signed)
Patient ID: Gina Galvan, female   DOB: Oct 21, 1990, 28 y.o.   MRN: [id number]    TELEHEALTH VIRTUAL OBSTETRICS VISIT ENCOUNTER NOTE  I connected with Ronelle Nigh on 01/16/2019 at  3:00 PM EDT by telephone at home and verified that I am speaking with the correct person using two identifiers.   I discussed the limitations, risks, security and privacy concerns of performing an evaluation and management service by telephone and the availability of in person appointments. I also discussed with the patient that there may be a patient responsible charge related to this service. The patient expressed understanding and agreed to proceed.  Subjective:  CHARLESE Galvan is a 28 y.o. G1P0000 at [redacted]w[redacted]d being followed for ongoing prenatal care.  She is currently monitored for the following issues for this low-risk pregnancy and has Breast nodule; Supervision of normal first pregnancy; and GBS bacteriuria on their problem list.  Patient reports no complaints. Reports fetal movement. Denies any contractions, bleeding or leaking of fluid. Takes BP once a week. 100 systolic didn't say diastolic. Has thoughts of online child birthing classes. Has questions of maximum allergy medication she can take while pregnant. She was on immunotherapy allergy injections. Is taking xyzal, allegra, she used to mix medications, by xyzal in the morning and a benadryl at night.   The following portions of the patient's history were reviewed and updated as appropriate: allergies, current medications, past family history, past medical history, past social history, past surgical history and problem list.   Objective:   General:  Alert, oriented and cooperative.   Mental Status: Normal mood and affect perceived. Normal judgment and thought content.  Rest of physical exam deferred due to type of encounter  Assessment and Plan:  Pregnancy: G1P0000 at [redacted]w[redacted]d  Preterm labor symptoms and general obstetric precautions including  but not limited to vaginal bleeding, contractions, leaking of fluid and fetal movement were reviewed in detail with the patient.   Repeat u/s in 4 weeks with PN2 I discussed the assessment and treatment plan with the patient. The patient was provided an opportunity to ask questions and all were answered. The patient agreed with the plan and demonstrated an understanding of the instructions. The patient was advised to call back or seek an in-person office evaluation/go to [facility]U at [facility] for any urgent or concerning symptoms. Please refer to After Visit Summary for other counseling recommendations.   I provided 6 minutes of non-face-to-face time during this encounter.  Followed by documentation  No follow-ups on file.  No future appointments.  By signing my name below, I, Arnette Norris, attest that this documentation has been prepared under the direction and in the presence of Tilda Burrow, MD. Electronically Signed: Arnette Norris Medical Scribe. 01/16/19. 3:34 PM.  I personally performed the services described in this documentation, which was SCRIBED in my presence. The recorded information has been reviewed and considered accurate. It has been edited as necessary during review. Tilda Burrow, MD

## 2019-02-12 ENCOUNTER — Other Ambulatory Visit: Payer: Self-pay | Admitting: Obstetrics and Gynecology

## 2019-02-12 ENCOUNTER — Encounter: Payer: Self-pay | Admitting: *Deleted

## 2019-02-12 DIAGNOSIS — O4420 Partial placenta previa NOS or without hemorrhage, unspecified trimester: Secondary | ICD-10-CM

## 2019-02-13 ENCOUNTER — Other Ambulatory Visit: Payer: Self-pay

## 2019-02-13 ENCOUNTER — Ambulatory Visit (INDEPENDENT_AMBULATORY_CARE_PROVIDER_SITE_OTHER): Payer: 59

## 2019-02-13 ENCOUNTER — Ambulatory Visit (INDEPENDENT_AMBULATORY_CARE_PROVIDER_SITE_OTHER): Payer: 59 | Admitting: Obstetrics and Gynecology

## 2019-02-13 VITALS — Wt 135.8 lb

## 2019-02-13 DIAGNOSIS — Z3A25 25 weeks gestation of pregnancy: Secondary | ICD-10-CM | POA: Diagnosis not present

## 2019-02-13 DIAGNOSIS — Z331 Pregnant state, incidental: Secondary | ICD-10-CM

## 2019-02-13 DIAGNOSIS — O4420 Partial placenta previa NOS or without hemorrhage, unspecified trimester: Secondary | ICD-10-CM

## 2019-02-13 DIAGNOSIS — Z3402 Encounter for supervision of normal first pregnancy, second trimester: Secondary | ICD-10-CM

## 2019-02-13 DIAGNOSIS — O4422 Partial placenta previa NOS or without hemorrhage, second trimester: Secondary | ICD-10-CM

## 2019-02-13 DIAGNOSIS — Z1389 Encounter for screening for other disorder: Secondary | ICD-10-CM

## 2019-02-13 LAB — POCT URINALYSIS DIPSTICK OB
Blood, UA: NEGATIVE
Glucose, UA: NEGATIVE
Ketones, UA: NEGATIVE
Leukocytes, UA: NEGATIVE
Nitrite, UA: NEGATIVE
POC,PROTEIN,UA: NEGATIVE

## 2019-02-13 NOTE — Patient Instructions (Signed)
[place] [facility] Call to Register: [phone] or [phone]   or   Register Online: [email or web address] THESE CLASSES FILL UP VERY QUICKLY, SO SIGN UP AS SOON AS YOU CAN!!! Please visit [facility] pregnancy website at [email or web address]  Childbirth Classes  Option 1: Birth & Baby Series ? Series of 3 weekly classes, on the same day of the week (can choose Mon-Thurs) from 6-9pm ? Helps you and your support person prepare for childbirth ? Reviews newborn care, labor & birth, cesarean birth, pain management, and comfort techniques ? Cost: $60 per couple for insured or self-pay, $30 per couple for Medicaid  Option 2: Weekend Birth & Baby ? This class is a weekend version of our Birth & Baby series.  It is designed for parents who have a difficult time fitting several weeks of classes into their schedule.   ? Covers the care of your newborn and the basics of labor and childbirth ? Friday 6:30pm-8:30pm Saturday 9am-4pm, includes lunch for you and your partner  ? Cost: $75 per couple for insured or self-pay, $30 per couple for Medicaid  Option 3: Natural Childbirth ? This series of 5 weekly classes is for expectant parents who want to learn and practice natural methods of coping with the process of labor and childbirth.  Can choose Mon or Tues, 7-9pm.   ? Covers relaxation, breathing, massage, visualization, role of the partner, and helpful positioning ? Participants learn how to be confident in their body's ability to give birth. Class empowers and helps parents make informed decisions about care. Includes discussion that will help new parents transition into the immediate postpartum period.  ? Cost: $75 per couple for insured or self-pay, $30 per couple for Medicaid  Option 4: Online Birth & Baby ? This online class offers you the freedom to complete a Birth & Baby series in the comfort of your own home.  The flexibility of this option allows you to review  sections at your own pace, at times convenient to you and your support people.  It includes additional video information, animations, quizzes and extended activities. Get organized with helpful eClass tools, checklists, and trackers.  ? Cost: $60 for 60 days of online access                                                                            Other Available Classes  Baby & Me Enjoy this time to discuss newborn & infant parenting topics and family adjustment issues with other new mothers in a relaxed environment. Each week brings a new speaker or baby-centered activity. We encourage mothers and their babies (birth to crawling) to join [place]. You are welcome to visit this group even if you haven't delivered yet! It's wonderful to make new friends early and watch other moms interact with their babies. No registration or fee.  Big Brother/Big Sister Let your children share in the joy of a new brother or sister in this special class designed just for them. Discussion includes how families care for babies: swaddling, holding, diapering, safety, as well as how they can be helpful in their new role. This class is designed for children ages 23 to 73, but any age is  welcome. Please register each child individually. $5 °Breastfeeding Support Group °This group is a mother-to-mother support circle where moms have the opportunity to share their breastfeeding experiences. A Breastfeeding Support nurse is present for questions and concerns. An infant scale is available for weight checks. No fee or registration.  °Breastfeeding Your Baby °Breastfeeding is a special time for mother and child. This class will help you feel ready to begin this important relationship. Your partner is encouraged to attend with you. Learn what to expect and feel more confident in the first days of breastfeeding your newborn. This class also addresses the most common fears and challenges of breastfeeding during the first few weeks, months, and  beyond. $30 per couple °Caring for Baby °This class is for expectant and adoptive parents who want to learn and practice the most up-to-date newborn care for their babies. Focus is on birth through first six weeks of life. Topics include feeding, bathing, diapering, crying, umbilical cord care, circumcision care and safe sleep. Parents learn how to recognize symptoms of illness and when to call the pediatrician. Register only the mom-to-be and your partner can plan to come with you. (*Note: This class is included in the Birth & Baby series and the Weekend Birth & Baby classes.) $10 per couple °Comfort Techniques & Tour °This 2-hour interactive class is designed for those who either do not wish to take the Birth & Baby series or for those who prefer our online childbirth class, but don't want to miss the opportunity to learn and practice hands-on techniques. These skills can help relieve some of the discomfort of labor and encourage your baby to rotate toward the best position for birth. You and your partner will be able to try a variety of labor positions with birth balls and rebozos as well as practice breathing, relaxation, and visual techniques. $20 per couple °Daddy Boot Camp °This course offers Dads-to-be the tools and knowledge needed to feel confident on their journey to becoming new fathers. Experienced dads, who have been trained as coaches, teach dads-to-be how to hold, comfort, diapers, swaddle and play with their infant while being able to support the new mom as well. $25 °Grandparent Love °Expecting a grandbaby? Learn about the latest infant care and safety recommendations and ways to support your own child as he or she transitions into the parenting role. $10 per person °Infant and Child CPR °Parents, grandparents, babysitters, and friends learn Cardio-Pulmonary Resuscitation skills for infants and children. You will also learn how to treat both conscious and unconscious choking infants and children.  Register each participant individually. (Note: This Family & Friends program does not offer certification.) $20 per person °Marvelous Multiples °Expecting twins, triplets, or more? This free 2-hour class covers the differences in labor, birth, parenting, and breastfeeding issues that face multiples' parents.  °Maternity Care Center Virtual Tour ° Online virtual tour of the new [facility] at [place] ° °Mom Talk °This free mom-led group offers support and connection to mothers as they journey through the adjustments and struggles of that sometimes overwhelming first year after the birth of a child. A member of our staff will be present to share resources and additional support if needed, as you care for yourself and baby. You are welcome to visit this group before you deliver! It's wonderful to meet new friends early and watch other moms interact with their babies.  °Waterbirth Class °Interested in a waterbirth? This free informational class will help you discover whether   waterbirth is the right fit for you and is required if you are planning a waterbirth. Education about waterbirth itself, supplies you may need, and what you may need from your support team is included in this class. Partners are encouraged to come.    Please check out [email or web address]   for more information on childbirth classes

## 2019-02-13 NOTE — Progress Notes (Signed)
Patient ID: Gina Galvan, female   DOB: 01-Jun-1991, 28 y.o.   MRN: [id number]    LOW-RISK PREGNANCY VISIT Patient name: Gina Galvan MRN [id number]  Date of birth: 10/12/1990 Chief Complaint:   Routine Prenatal Visit ([place])  History of Present Illness:   Gina Galvan is a 28 y.o. G26P0000 female at [redacted]w[redacted]d with an Estimated Date of Delivery: 05/23/19 being seen today for ongoing management of a low-risk pregnancy.  Today she reports no complaints. Contractions: Not present. Vag. Bleeding: None.  Movement: Present. denies leaking of fluid. Review of Systems:   Pertinent items are noted in HPI Denies abnormal vaginal discharge w/ itching/odor/irritation, headaches, visual changes, shortness of breath, chest pain, abdominal pain, severe nausea/vomiting, or problems with urination or bowel movements unless otherwise stated above. Pertinent History Reviewed:  Reviewed past medical,surgical, social, obstetrical and family history.  Reviewed problem list, medications and allergies. Physical Assessment:   Vitals:   02/13/19 0927  Weight: 135 lb 12.8 oz (61.6 kg)  Body mass index is 24.06 kg/m.        Physical Examination:   General appearance: Well appearing, and in no distress  Mental status: Alert, oriented to person, place, and time  Skin: Warm & dry  Cardiovascular: Normal heart rate noted  Respiratory: Normal respiratory effort, no distress  Abdomen: Soft, gravid, nontender  Pelvic: Cervical exam deferred         Extremities: Edema: None  Fetal Status: Fetal Heart Rate (bpm): 152 u/s Fundal Height: 27 cm Movement: Present    Results for orders placed or performed in visit on 02/13/19 (from the past 24 hour(s))  POC Urinalysis Dipstick OB   Collection Time: 02/13/19  9:31 AM  Result Value Ref Range   Color, UA     Clarity, UA     Glucose, UA Negative Negative   Bilirubin, UA     Ketones, UA neg    Spec Grav, UA     Blood, UA neg    pH, UA     POC,PROTEIN,UA Negative  Negative, Trace, Small (1+), Moderate (2+), Large (3+), 4+   Urobilinogen, UA     Nitrite, UA neg    Leukocytes, UA Negative Negative   Appearance     Odor      Assessment & Plan:  1) Low-risk pregnancy G1P0000 at [redacted]w[redacted]d with an Estimated Date of Delivery: 05/23/19     Meds: No orders of the defined types were placed in this encounter.  Labs/procedures today: [place] 25+3 wks,cephalic,resolved posterior low lying placenta,tip of placenta to cx 5 cm,cx 4.1 cm,bilat adnexa's wnl,fhr 152 bpm,afi 14 cm,EFW 988 g 78%  Plan:   1. Continue routine obstetrical care  2. 2 weeks PN2 3. 4 weeks televisit at 29 weeks  Follow-up: Return in about 2 weeks (around 02/27/2019) for PN2 (sugar test).  Orders Placed This Encounter  Procedures  . POC Urinalysis Dipstick OB   By signing my name below, I, Samul Dada, attest that this documentation has been prepared under the direction and in the presence of Jonnie Kind, MD. Electronically Signed: [place]. 02/13/19. 9:48 AM.  I personally performed the services described in this documentation, which was SCRIBED in my presence. The recorded information has been reviewed and considered accurate. It has been edited as necessary during review. Jonnie Kind, MD

## 2019-02-13 NOTE — Progress Notes (Signed)
[place] 93+9 wks,cephalic,resolved posterior low lying placenta,tip of placenta to cx 5 cm,cx 4.1 cm,bilat adnexa's wnl,fhr 152 bpm,afi 14 cm,EFW 988 g 78%

## 2019-02-27 ENCOUNTER — Other Ambulatory Visit: Payer: 59

## 2019-03-13 ENCOUNTER — Ambulatory Visit (INDEPENDENT_AMBULATORY_CARE_PROVIDER_SITE_OTHER): Payer: 59 | Admitting: Advanced Practice Midwife

## 2019-03-13 ENCOUNTER — Encounter: Payer: Self-pay | Admitting: Advanced Practice Midwife

## 2019-03-13 VITALS — BP 99/71 | HR 75 | Wt 138.0 lb

## 2019-03-13 DIAGNOSIS — Z3403 Encounter for supervision of normal first pregnancy, third trimester: Secondary | ICD-10-CM

## 2019-03-13 DIAGNOSIS — Z3A29 29 weeks gestation of pregnancy: Secondary | ICD-10-CM

## 2019-03-13 NOTE — Patient Instructions (Addendum)
1. Before your test, do not eat or drink anything for 8-10 hours prior to your  appointment (a small amount of water is allowed and you may take any medicines you normally take). Be sure to drink lots of water the day before. 2. When you arrive, your blood will be drawn for a 'fasting' blood sugar level.  Then you will be given a sweetened carbonated beverage to drink. You should  complete drinking this beverage within five minutes. After finishing the  beverage, you will have your blood drawn exactly 1 and 2 hours later. Having  your blood drawn on time is an important part of this test. A total of three blood  samples will be done. 3. The test takes approximately 2  hours. During the test, do not have anything to  eat or drink. Do not smoke, chew gum (not even sugarless gum) or use breath mints.  4. During the test you should remain close by and seated as much as possible and  avoid walking around. You may want to bring a book or something else to  occupy your time.  5. After your test, you may eat and drink as normal. You may want to bring a snack  to eat after the test is finished. Your provider will advise you as to the results of  this test and any follow-up if necessary  If your sugar test is positive for gestational diabetes, you will be given an phone call and further instructions discussed. If you wish to know all of your test results before your next appointment, feel free to call the office, or look up your test results on Mychart.  (The range that the lab uses for normal values of the sugar test are not necessarily the range that is used for pregnant women; if your results are within the normal range, they are definitely normal.  However, if a value is deemed "high" by the lab, it may not be too high for a pregnant woman.  We will need to discuss the results if your value(s) fall in the "high" category).     Tdap Vaccine  It is recommended that you get the Tdap vaccine during the  third trimester of EACH pregnancy to help protect your baby from getting pertussis (whooping cough)  27-36 weeks is the BEST time to do this so that you can pass the protection on to your baby. During pregnancy is better than after pregnancy, but if you are unable to get it during pregnancy it will be offered at the hospital.  You will be offered this vaccine in the office after 27 weeks.  If you do not have health insurance, you can get the vaccine from the [facility] (no appointment needed).   Everyone who will be around your baby should also be up-to-date on their vaccines. Adults (who are not pregnant) only need 1 dose of Tdap during adulthood.     [place] Pediatricians/Family Doctors:  [facility] [phone]            [facility] [phone]                 [facility] [phone] (usually not accepting new patients unless you have family there already, you are always welcome to call and ask)       [facility] [phone]       [place] Pediatricians/Family Doctors:   [facility]: [phone]  [facility]: [phone]  [facility] [place]: [phone]  [facility] Doctors:   [facility]: [place] [facility]: [place]:  [place]: [phone]  Kinesiology taping for pregnancy:  Gina Galvan has good vidoes of "how tos" for lower back, pelvic, hip pain; swelling of feet, etc

## 2019-03-13 NOTE — Progress Notes (Signed)
   TELEHEALTH VIRTUAL OBSTETRICS VISIT ENCOUNTER NOTE  I connected with Gina Galvan on 03/13/19 at  8:45 AM EDT by telephone at home and verified that I am speaking with the correct person using two identifiers.   I discussed the limitations, risks, security and privacy concerns of performing an evaluation and management service by telephone and the availability of in person appointments. I also discussed with the patient that there may be a patient responsible charge related to this service. The patient expressed understanding and agreed to proceed.  Subjective:  Gina Galvan is a 28 y.o. G1P0000 at [redacted]w[redacted]d being followed for ongoing prenatal care.  She is currently monitored for the following issues for this low-risk pregnancy and has Breast nodule; Supervision of normal first pregnancy; and GBS bacteriuria on their problem list.  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 May try to go to 8 hour.  Encouraged to wear N95 despite them being uncomfortable. Reports fetal movement. Denies any contractions, bleeding or leaking of fluid.   The following portions of the patient's history were reviewed and updated as appropriate: allergies, current medications, past family history, past medical history, past social history, past surgical history and problem list.   Objective:   General:  Alert, oriented and cooperative.   Mental Status: Normal mood and affect perceived. Normal judgment and thought content.  Rest of physical exam deferred due to type of encounter  Assessment and Plan:  Pregnancy: G1P0000 at [redacted]w[redacted]d 1. Encounter for supervision of normal first pregnancy in third trimester   Preterm labor symptoms and general obstetric precautions including but not limited to vaginal bleeding, contractions, leaking of fluid and fetal movement were reviewed in detail with the patient.  I discussed the assessment and treatment plan with the patient. The patient  was provided an opportunity to ask questions and all were answered. The patient agreed with the plan and demonstrated an understanding of the instructions. The patient was advised to call back or seek an in-person office evaluation/go to MAU at [facility] for any urgent or concerning symptoms. Please refer to After Visit Summary for other counseling recommendations.   May try kinesiology taping (mat belt too restrictive).   Needs PN2/rhogam  I provided 12 minutes of non-face-to-face time during this encounter.  Return for pn2 only asap; and 3 weeks in person LROB/rhogam.  Future Appointments  Date Time Provider [place]  03/27/2019  8:45 AM [facility]N LAB [facility]    Christin Fudge, [place] for [employer], [place]

## 2019-03-27 ENCOUNTER — Other Ambulatory Visit: Payer: Self-pay

## 2019-03-27 ENCOUNTER — Other Ambulatory Visit: Payer: 59

## 2019-03-27 DIAGNOSIS — Z3A31 31 weeks gestation of pregnancy: Secondary | ICD-10-CM

## 2019-03-27 DIAGNOSIS — Z3403 Encounter for supervision of normal first pregnancy, third trimester: Secondary | ICD-10-CM

## 2019-03-28 LAB — CBC
Hematocrit: 36.6 % (ref 34.0–46.6)
Hemoglobin: 12.8 g/dL (ref 11.1–15.9)
MCH: 30.5 pg (ref 26.6–33.0)
MCHC: 35 g/dL (ref 31.5–35.7)
MCV: 87 fL (ref 79–97)
Platelets: 185 10*3/uL (ref 150–450)
RBC: 4.19 x10E6/uL (ref 3.77–5.28)
RDW: 13.1 % (ref 11.7–15.4)
WBC: 10 10*3/uL (ref 3.4–10.8)

## 2019-03-28 LAB — ANTIBODY SCREEN: Antibody Screen: NEGATIVE

## 2019-03-28 LAB — GLUCOSE TOLERANCE, 2 HOURS W/ 1HR
Glucose, 1 hour: 108 mg/dL (ref 65–179)
Glucose, 2 hour: 141 mg/dL (ref 65–152)
Glucose, Fasting: 68 mg/dL (ref 65–91)

## 2019-03-28 LAB — RPR: RPR Ser Ql: NONREACTIVE

## 2019-03-28 LAB — HIV ANTIBODY (ROUTINE TESTING W REFLEX): HIV Screen 4th Generation wRfx: NONREACTIVE

## 2019-04-05 ENCOUNTER — Ambulatory Visit (INDEPENDENT_AMBULATORY_CARE_PROVIDER_SITE_OTHER): Payer: 59 | Admitting: Obstetrics & Gynecology

## 2019-04-05 ENCOUNTER — Other Ambulatory Visit: Payer: Self-pay

## 2019-04-05 VITALS — BP 116/81 | HR 90 | Wt 144.0 lb

## 2019-04-05 DIAGNOSIS — Z23 Encounter for immunization: Secondary | ICD-10-CM | POA: Diagnosis not present

## 2019-04-05 DIAGNOSIS — Z3A33 33 weeks gestation of pregnancy: Secondary | ICD-10-CM

## 2019-04-05 DIAGNOSIS — O360130 Maternal care for anti-D [Rh] antibodies, third trimester, not applicable or unspecified: Secondary | ICD-10-CM

## 2019-04-05 DIAGNOSIS — Z3403 Encounter for supervision of normal first pregnancy, third trimester: Secondary | ICD-10-CM

## 2019-04-05 MED ORDER — RHO D IMMUNE GLOBULIN 1500 UNIT/2ML IJ SOSY: 300.0000 ug | PREFILLED_SYRINGE | Freq: Once | INTRAMUSCULAR | Status: DC

## 2019-04-05 NOTE — Progress Notes (Signed)
   LOW-RISK PREGNANCY VISIT Patient name: Gina Galvan MRN [id number]  Date of birth: July 17, 1991 Chief Complaint:   Routine Prenatal Visit  History of Present Illness:   Gina Galvan is a 28 y.o. G30P0000 female at [redacted]w[redacted]d with an Estimated Date of Delivery: 05/23/19 being seen today for ongoing management of a low-risk pregnancy.  Today she reports no complaints. Contractions: Not present. Vag. Bleeding: None.  Movement: Present. denies leaking of fluid. Review of Systems:   Pertinent items are noted in HPI Denies abnormal vaginal discharge w/ itching/odor/irritation, headaches, visual changes, shortness of breath, chest pain, abdominal pain, severe nausea/vomiting, or problems with urination or bowel movements unless otherwise stated above. Pertinent History Reviewed:  Reviewed past medical,surgical, social, obstetrical and family history.  Reviewed problem list, medications and allergies. Physical Assessment:   Vitals:   04/05/19 0926  BP: 116/81  Pulse: 90  Weight: 144 lb (65.3 kg)  Body mass index is 25.51 kg/m.        Physical Examination:   General appearance: Well appearing, and in no distress  Mental status: Alert, oriented to person, place, and time  Skin: Warm & dry  Cardiovascular: Normal heart rate noted  Respiratory: Normal respiratory effort, no distress  Abdomen: Soft, gravid, nontender  Pelvic: Cervical exam deferred         Extremities: Edema: None  Fetal Status: Fetal Heart Rate (bpm): 140 Fundal Height: 34 cm Movement: Present    No results found for this or any previous visit (from the past 24 hour(s)).  Assessment & Plan:  1) Low-risk pregnancy G1P0000 at [redacted]w[redacted]d with an Estimated Date of Delivery: 05/23/19   2) GBS positive in urine,    Meds:  Meds ordered this encounter  Medications  . DISCONTD: rho (d) immune globulin (RHIG/RHOPHYLAC) injection 300 mcg   Labs/procedures today:   Plan:  Continue routine obstetrical care   Reviewed: Preterm  labor symptoms and general obstetric precautions including but not limited to vaginal bleeding, contractions, leaking of fluid and fetal movement were reviewed in detail with the patient.  All questions were answered. has home bp cuff.  Check bp weekly, let us know if >140/90.   Follow-up: Return in about 3 weeks (around 04/26/2019) for [place].  Orders Placed This Encounter  Procedures  . Tdap vaccine greater than or equal to 7yo IM  . RHO (D) Immune Globulin   Florian Buff  04/05/2019 9:53 AM

## 2019-04-25 ENCOUNTER — Encounter: Payer: Self-pay | Admitting: Advanced Practice Midwife

## 2019-04-25 ENCOUNTER — Ambulatory Visit (INDEPENDENT_AMBULATORY_CARE_PROVIDER_SITE_OTHER): Payer: 59 | Admitting: Advanced Practice Midwife

## 2019-04-25 ENCOUNTER — Other Ambulatory Visit: Payer: Self-pay

## 2019-04-25 VITALS — BP 121/83 | HR 80 | Wt 152.0 lb

## 2019-04-25 DIAGNOSIS — Z3403 Encounter for supervision of normal first pregnancy, third trimester: Secondary | ICD-10-CM

## 2019-04-25 DIAGNOSIS — Z3A36 36 weeks gestation of pregnancy: Secondary | ICD-10-CM

## 2019-04-25 NOTE — Progress Notes (Signed)
   LOW-RISK PREGNANCY VISIT Patient name: CECIL BIXBY MRN [id number]  Date of birth: 08-Feb-1991 Chief Complaint:   Routine Prenatal Visit  History of Present Illness:   Gina Galvan is a 28 y.o. G46P0000 female at [redacted]w[redacted]d with an Estimated Date of Delivery: 05/23/19 being seen today for ongoing management of a low-risk pregnancy.  Today she reports no complaints. Contractions: Not present. Vag. Bleeding: None.  Movement: Present. denies leaking of fluid. Review of Systems:   Pertinent items are noted in HPI Denies abnormal vaginal discharge w/ itching/odor/irritation, headaches, visual changes, shortness of breath, chest pain, abdominal pain, severe nausea/vomiting, or problems with urination or bowel movements unless otherwise stated above.  Pertinent History Reviewed:  Medical & Surgical Hx:   Past Medical History:  Diagnosis Date  . Breast nodule 07/19/2013   Left nodule at 4 0' clock mobile nontender  . Contraceptive management 07/19/2013  . Depression   . Tachycardia   . Vaginal irritation 06/10/2014  . Yeast infection 06/10/2014   Past Surgical History:  Procedure Laterality Date  . TONSILLECTOMY AND ADENOIDECTOMY    . WISDOM TOOTH EXTRACTION     Family History  Problem Relation Age of Onset  . Hypertension Maternal Grandmother   . Heart attack Paternal Grandfather   . Stroke Paternal Grandmother   . Cancer Brother        thyroid and melonoma    Current Outpatient Medications:  .  Fexofenadine HCl (ALLEGRA PO), Take by mouth as needed., Disp: , Rfl:  .  Levocetirizine Dihydrochloride (XYZAL PO), Take by mouth at bedtime., Disp: , Rfl:  .  Prenatal Vit-Fe Fumarate-FA (PRENATAL VITAMIN PO), Take by mouth., Disp: , Rfl:  Social History: Reviewed -  reports that she has never smoked. She has never used smokeless tobacco.  Physical Assessment:   Vitals:   04/25/19 0902  BP: 121/83  Pulse: 80  Weight: 152 lb (68.9 kg)  Body mass index is 26.93 kg/m.    Physical Examination:   General appearance: Well appearing, and in no distress  Mental status: Alert, oriented to person, place, and time  Skin: Warm & dry  Cardiovascular: Normal heart rate noted  Respiratory: Normal respiratory effort, no distress  Abdomen: Soft, gravid, nontender  Pelvic: Cervical exam performed         Extremities: Edema: Mild pitting, slight indentation  Fetal Status:     Movement: Present    No results found for this or any previous visit (from the past 24 hour(s)).  Assessment & Plan:  1) Low-risk pregnancy G1P0000 at [redacted]w[redacted]d with an Estimated Date of Delivery: 05/23/19   2) note given to decrease work shift to 8hr,    Labs/procedures/US today: gc/chl  Plan:  Continue routine obstetrical care    Follow-up: Return in about 1 week (around 05/02/2019) for OB Mychart visit.  Orders Placed This Encounter  Procedures  . GC/Chlamydia Probe Amp   Christin Fudge CNM 04/25/2019 9:20 AM

## 2019-04-25 NOTE — Patient Instructions (Signed)

## 2019-04-29 LAB — GC/CHLAMYDIA PROBE AMP
Chlamydia trachomatis, NAA: NEGATIVE
Neisseria Gonorrhoeae by PCR: NEGATIVE

## 2019-05-03 ENCOUNTER — Telehealth (INDEPENDENT_AMBULATORY_CARE_PROVIDER_SITE_OTHER): Payer: 59 | Admitting: Women's Health

## 2019-05-03 ENCOUNTER — Other Ambulatory Visit: Payer: Self-pay

## 2019-05-03 ENCOUNTER — Encounter: Payer: Self-pay | Admitting: Women's Health

## 2019-05-03 VITALS — BP 118/80 | HR 79 | Ht 62.0 in

## 2019-05-03 DIAGNOSIS — Z3403 Encounter for supervision of normal first pregnancy, third trimester: Secondary | ICD-10-CM

## 2019-05-03 DIAGNOSIS — Z3A37 37 weeks gestation of pregnancy: Secondary | ICD-10-CM

## 2019-05-03 DIAGNOSIS — R8271 Bacteriuria: Secondary | ICD-10-CM

## 2019-05-03 NOTE — Patient Instructions (Signed)
Gina Galvan, I greatly value your feedback.  If you receive a survey following your visit with [place] today, we appreciate you taking the time to fill it out.  Thanks, Knute Neu, CNM, [facility]  [place]!!! It is now [place] at [facility] ([place], [place] [address]) Entrance located off of [address] parking   Go to [email or web address] to register for FREE online childbirth classes    Call the office [phone]) or go to [facility] if:  You begin to have strong, frequent contractions  Your water breaks.  Sometimes it is a big gush of fluid, sometimes it is just a trickle that keeps getting your panties wet or running down your legs  You have vaginal bleeding.  It is normal to have a small amount of spotting if your cervix was checked.   You don't feel your baby moving like normal.  If you don't, get you something to eat and drink and lay down and focus on feeling your baby move.  You should feel at least 10 movements in 2 hours.  If you don't, you should call the office or go to [place] Blood Pressure Monitoring for Patients   Your provider has recommended that you check your blood pressure (BP) at least once a week at home. If you do not have a blood pressure cuff at home, one will be provided for you. Contact your provider if you have not received your monitor within 1 week.   Helpful Tips for Accurate Home Blood Pressure Checks   Don't smoke, exercise, or drink caffeine 30 minutes before checking your BP  Use the restroom before checking your BP (a full bladder can raise your pressure)  Relax in a comfortable upright chair  Feet on the ground  Left arm resting comfortably on a flat surface at the level of your heart  Legs uncrossed  Back supported  Sit quietly and don't talk  Place the cuff on your bare arm  Adjust snuggly, so that only two fingertips can fit between your skin  and the top of the cuff  Check 2 readings separated by at least one minute  Keep a log of your BP readings  For a visual, please reference this diagram: http://ccnc.care/bpdiagram  Provider Name: [facility]     Phone: [phone]  Zone 1: ALL CLEAR  Continue to monitor your symptoms:   BP reading is less than 140 (top number) or less than 90 (bottom number)   No right upper stomach pain  No headaches or seeing spots  No feeling nauseated or throwing up  No swelling in face and hands  Zone 2: CAUTION Call your doctor's office for any of the following:   BP reading is greater than 140 (top number) or greater than 90 (bottom number)   Stomach pain under your ribs in the middle or right side  Headaches or seeing spots  Feeling nauseated or throwing up  Swelling in face and hands  Zone 3: EMERGENCY  Seek immediate medical care if you have any of the following:   BP reading is greater than160 (top number) or greater than 110 (bottom number)  Severe headaches not improving with Tylenol  Serious difficulty catching your breath  Any worsening symptoms from Zone 2    Braxton Hicks Contractions Contractions of the uterus can occur throughout pregnancy, but they are not always a sign that you are  in labor. You may have practice contractions called Braxton Hicks contractions. These false labor contractions are sometimes confused with true labor. What are [place] contractions? Braxton Hicks contractions are tightening movements that occur in the muscles of the uterus before labor. Unlike true labor contractions, these contractions do not result in opening (dilation) and thinning of the cervix. Toward the end of pregnancy (32-34 weeks), Braxton Hicks contractions can happen more often and may become stronger. These contractions are sometimes difficult to tell apart from true labor because they can be very uncomfortable. You should not feel embarrassed if you go to  the hospital with false labor. Sometimes, the only way to tell if you are in true labor is for your health care provider to look for changes in the cervix. The health care provider will do a physical exam and may monitor your contractions. If you are not in true labor, the exam should show that your cervix is not dilating and your water has not broken. If there are no other health problems associated with your pregnancy, it is completely safe for you to be sent home with false labor. You may continue to have Braxton Hicks contractions until you go into true labor. How to tell the difference between true labor and false labor True labor  Contractions last 30-70 seconds.  Contractions become very regular.  Discomfort is usually felt in the top of the uterus, and it spreads to the lower abdomen and low back.  Contractions do not go away with walking.  Contractions usually become more intense and increase in frequency.  The cervix dilates and gets thinner. False labor  Contractions are usually shorter and not as strong as true labor contractions.  Contractions are usually irregular.  Contractions are often felt in the front of the lower abdomen and in the groin.  Contractions may go away when you walk around or change positions while lying down.  Contractions get weaker and are shorter-lasting as time goes on.  The cervix usually does not dilate or become thin. Follow these instructions at home:   Take over-the-counter and prescription medicines only as told by your health care provider.  Keep up with your usual exercises and follow other instructions from your health care provider.  Eat and drink lightly if you think you are going into labor.  If Braxton Hicks contractions are making you uncomfortable: ? Change your position from lying down or resting to walking, or change from walking to resting. ? Sit and rest in a tub of warm water. ? Drink enough fluid to keep your urine  pale yellow. Dehydration may cause these contractions. ? Do slow and deep breathing several times an hour.  Keep all follow-up prenatal visits as told by your health care provider. This is important. Contact a health care provider if:  You have a fever.  You have continuous pain in your abdomen. Get help right away if:  Your contractions become stronger, more regular, and closer together.  You have fluid leaking or gushing from your vagina.  You pass blood-tinged mucus (bloody show).  You have bleeding from your vagina.  You have low back pain that you never had before.  You feel your babys head pushing down and causing pelvic pressure.  Your baby is not moving inside you as much as it used to. Summary  Contractions that occur before labor are called Braxton Hicks contractions, false labor, or practice contractions.  Braxton Hicks contractions are usually shorter, weaker, farther apart, and  less regular than true labor contractions. True labor contractions usually become progressively stronger and regular, and they become more frequent.  Manage discomfort from [facility] contractions by changing position, resting in a warm bath, drinking plenty of water, or practicing deep breathing. This information is not intended to replace advice given to you by your health care provider. Make sure you discuss any questions you have with your health care provider. Document Released: 12/29/2016 Document Revised: 07/28/2017 Document Reviewed: 12/29/2016 Elsevier Patient Education  2020 [employer].

## 2019-05-03 NOTE — Progress Notes (Signed)
   TELEHEALTH VIRTUAL OBSTETRICS VISIT ENCOUNTER NOTE Patient name: Gina Galvan MRN [id number]  Date of birth: July 23, 1991  I connected with patient on 05/03/19 at  9:30 AM EDT by MyChart video  and verified that I am speaking with the correct person using two identifiers. Due to COVID-19 recommendations, pt is not currently in our office.    I discussed the limitations, risks, security and privacy concerns of performing an evaluation and management service by telephone and the availability of in person appointments. I also discussed with the patient that there may be a patient responsible charge related to this service. The patient expressed understanding and agreed to proceed.  Chief Complaint:   Routine Prenatal Visit  History of Present Illness:   Gina Galvan is a 28 y.o. G1P0000 female at [redacted]w[redacted]d with an Estimated Date of Delivery: 05/23/19 being evaluated today for ongoing management of a low-risk pregnancy.  Today she reports some swelling. Contractions: Not present. Vag. Bleeding: None.  Movement: Present. denies leaking of fluid. Review of Systems:   Pertinent items are noted in HPI Denies abnormal vaginal discharge w/ itching/odor/irritation, headaches, visual changes, shortness of breath, chest pain, abdominal pain, severe nausea/vomiting, or problems with urination or bowel movements unless otherwise stated above. Pertinent History Reviewed:  Reviewed past medical,surgical, social, obstetrical and family history.  Reviewed problem list, medications and allergies. Physical Assessment:   Vitals:   05/03/19 0948  BP: 118/80  Pulse: 79  Height: 5\' 2"  (1.575 m)  Body mass index is 27.8 kg/m.        Physical Examination:   General:  Alert, oriented and cooperative.   Mental Status: Normal mood and affect perceived. Normal judgment and thought content.  Rest of physical exam deferred due to type of encounter  No results found for this or any previous visit (from the past  24 hour(s)).  Assessment & Plan:  1) Pregnancy G1P0000 at [redacted]w[redacted]d with an Estimated Date of Delivery: 05/23/19    Meds: No orders of the defined types were placed in this encounter.  Labs/procedures today: none  Plan:  Continue routine obstetrical care.  Has home bp cuff.  Check bp weekly, let us know if >140/90.   Reviewed: Term labor symptoms and general obstetric precautions including but not limited to vaginal bleeding, contractions, leaking of fluid and fetal movement were reviewed in detail with the patient. The patient was advised to call back or seek an in-person office evaluation/go to [facility] at [facility] for any urgent or concerning symptoms. All questions were answered. Please refer to After Visit Summary for other counseling recommendations.    I provided 10 minutes of non-face-to-face time during this encounter.  Follow-up: Return in about 1 week (around 05/10/2019) for LROB, MyChart Video.  No orders of the defined types were placed in this encounter.  Roma Schanz CNM, WHNP-BC 05/03/2019 10:14 AM

## 2019-05-13 ENCOUNTER — Telehealth (INDEPENDENT_AMBULATORY_CARE_PROVIDER_SITE_OTHER): Payer: 59 | Admitting: Family Medicine

## 2019-05-13 ENCOUNTER — Other Ambulatory Visit: Payer: Self-pay

## 2019-05-13 ENCOUNTER — Encounter: Payer: Self-pay | Admitting: Family Medicine

## 2019-05-13 VITALS — BP 126/82 | HR 80 | Ht 62.0 in

## 2019-05-13 DIAGNOSIS — Z3403 Encounter for supervision of normal first pregnancy, third trimester: Secondary | ICD-10-CM

## 2019-05-13 DIAGNOSIS — Z3A38 38 weeks gestation of pregnancy: Secondary | ICD-10-CM

## 2019-05-13 DIAGNOSIS — R8271 Bacteriuria: Secondary | ICD-10-CM

## 2019-05-13 NOTE — Progress Notes (Signed)
I connected with@ on 05/13/19 at  9:30 AM EDT by: Mychart and verified that I am speaking with the correct person using two identifiers.  Patient is located at home and provider is located at [facility].     The purpose of this virtual visit is to provide medical care while limiting exposure to the novel coronavirus. I discussed the limitations, risks, security and privacy concerns of performing an evaluation and management service by Mycart and the availability of in person appointments. I also discussed with the patient that there may be a patient responsible charge related to this service. By engaging in this virtual visit, you consent to the provision of healthcare.  Additionally, you authorize for your insurance to be billed for the services provided during this visit.  The patient expressed understanding and agreed to proceed.  The following staff members participated in the virtual visit:  LPN    PRENATAL VISIT NOTE  Subjective:  Gina Galvan is a 28 y.o. G1P0000 at [redacted]w[redacted]d  for phone visit for ongoing prenatal care.  She is currently monitored for the following issues for this low-risk pregnancy and has Breast nodule; Supervision of normal first pregnancy; and GBS bacteriuria on their problem list.  Patient reports swelling- starting at 30 wks. Does not improve in the morning.  wearing compression stocking .  Reports HA since Friday- tried Tylenol and it did not help, took 1g.  Worsens throughout the day.  BP never > 140/90.  Contractions: Not present. Vag. Bleeding: None.  Movement: Present. Denies leaking of fluid.   The following portions of the patient's history were reviewed and updated as appropriate: allergies, current medications, past family history, past medical history, past social history, past surgical history and problem list.   Objective:   Vitals:   05/13/19 0938  BP: 126/82  Pulse: 80  Height: 5\' 2"  (1.575 m)   Self-Obtained  Fetal Status:     Movement: Present      Assessment and Plan:  Pregnancy: G1P0000 at [redacted]w[redacted]d 1. Encounter for supervision of normal first pregnancy in third trimester Up to date Having significant swelling - worsening over all. BP is WNL. Reports HA starting that end of last week that is intermittent with some mild alleviation with tylenol. Recommended patient take tylenol 1 g with benadryl and call office if HA is not resolved.  Recommended MAU is HA resumes of worsens. If patient comes to office will need CMP, CBC, Urine Protein-Creatine to eval for PEC.  Patient will come to office in 4d for in person evaluation and IOL planning visit.   2. GBS bacteriuria PCN in labor  Term labor symptoms and general obstetric precautions including but not limited to vaginal bleeding, contractions, leaking of fluid and fetal movement were reviewed in detail with the patient.  Return in about 4 days (around 05/17/2019) for Routine prenatal care, In person- BP check. .  No future appointments.   Time spent on virtual visit: 15 minutes  Caren Macadam, MD

## 2019-05-15 ENCOUNTER — Telehealth: Payer: Self-pay | Admitting: *Deleted

## 2019-05-15 NOTE — Telephone Encounter (Signed)
[employer] called to let us know that patient has elevated bp reading of 144/91 and 120/89. They said that patient told them she has a headache.

## 2019-05-15 NOTE — Telephone Encounter (Signed)
Patient called with concerns regarding her BP and headache.

## 2019-05-15 NOTE — Telephone Encounter (Signed)
Patient states she had an elevated blood pressure last night and this morning.  She has since taking her pressure and its been normal. She also had a headache but took some Tylenol which did resolve it. Denies any vision changes or upper gastric pain. Advised to start checking BP 4 times daily and let us know or go to the hospital if elevated or she started to have any vision changes or persistent headache. Pt verbalized understanding.

## 2019-05-17 ENCOUNTER — Other Ambulatory Visit: Payer: Self-pay

## 2019-05-17 ENCOUNTER — Other Ambulatory Visit (INDEPENDENT_AMBULATORY_CARE_PROVIDER_SITE_OTHER): Payer: 59 | Admitting: Obstetrics and Gynecology

## 2019-05-17 DIAGNOSIS — Z3403 Encounter for supervision of normal first pregnancy, third trimester: Secondary | ICD-10-CM

## 2019-05-17 DIAGNOSIS — O98812 Other maternal infectious and parasitic diseases complicating pregnancy, second trimester: Secondary | ICD-10-CM

## 2019-05-17 DIAGNOSIS — Z3A39 39 weeks gestation of pregnancy: Secondary | ICD-10-CM

## 2019-05-17 DIAGNOSIS — R8271 Bacteriuria: Secondary | ICD-10-CM

## 2019-05-17 NOTE — Progress Notes (Signed)
Patient ID: Gina Galvan, female   DOB: 22-Oct-1990, 28 y.o.   MRN: [id number]    LOW-RISK PREGNANCY VISIT Patient name: Gina Galvan MRN [id number]  Date of birth: 01-09-1991 Chief Complaint:   Blood Pressure Check  History of Present Illness:   Gina Galvan is a 28 y.o. G10P0000 female at [redacted]w[redacted]d with an Estimated Date of Delivery: 05/23/19 being seen today for ongoing management of a low-risk pregnancy. Has noticed some increased BP since . She requests cervical exam. Has done several self checks, GFM no ROM. Today she reports no complaints. Contractions: Not present.  .  Movement: Present. denies leaking of fluid. Review of Systems:   Pertinent items are noted in HPI Denies abnormal vaginal discharge w/ itching/odor/irritation, headaches, visual changes, shortness of breath, chest pain, abdominal pain, severe nausea/vomiting, or problems with urination or bowel movements unless otherwise stated above. Pertinent History Reviewed:  Reviewed past medical,surgical, social, obstetrical and family history.  Reviewed problem list, medications and allergies. Physical Assessment:   Vitals:   05/17/19 0947  BP: 137/89  Pulse: 63  Weight: 152 lb (68.9 kg)  Body mass index is 27.8 kg/m.        Physical Examination:   General appearance: Well appearing, and in no distress  Mental status: Alert, oriented to person, place, and time  Skin: Warm & dry  Cardiovascular: Normal heart rate noted  Respiratory: Normal respiratory effort, no distress  Abdomen: Soft, gravid, nontender  Pelvic: Cervical exam deferred membranes stripped 2 cm, mid-position 40% thinned        Extremities: Edema: Trace  Fetal Status: Fetal Heart Rate (bpm): 145   Movement: Present    No results found for this or any previous visit (from the past 24 hour(s)).  Assessment & Plan:  1) Low-risk pregnancy G1P0000 at [redacted]w[redacted]d with an Estimated Date of Delivery: 05/23/19     Meds: No orders of the defined types were  placed in this encounter.  Labs/procedures today: None  Plan:  Continue routine obstetrical care   Reviewed: Term labor symptoms and general obstetric precautions including but not limited to vaginal bleeding, contractions, leaking of fluid and fetal movement were reviewed in detail with the patient.  All questions were answered. Has home bp cuff.   Follow-up: No follow-ups on file.  No orders of the defined types were placed in this encounter.  By signing my name below, I, Samul Dada, attest that this documentation has been prepared under the direction and in the presence of Jonnie Kind, MD. Electronically Signed: Marcus Hook. 05/17/19. 10:26 AM.  I personally performed the services described in this documentation, which was SCRIBED in my presence. The recorded information has been reviewed and considered accurate. It has been edited as necessary during review. Jonnie Kind, MD

## 2019-05-20 ENCOUNTER — Other Ambulatory Visit: Payer: Self-pay

## 2019-05-20 ENCOUNTER — Ambulatory Visit (INDEPENDENT_AMBULATORY_CARE_PROVIDER_SITE_OTHER): Payer: 59 | Admitting: *Deleted

## 2019-05-20 VITALS — BP 136/87 | HR 75 | Wt 154.5 lb

## 2019-05-20 DIAGNOSIS — Z1389 Encounter for screening for other disorder: Secondary | ICD-10-CM

## 2019-05-20 DIAGNOSIS — Z3689 Encounter for other specified antenatal screening: Secondary | ICD-10-CM | POA: Diagnosis not present

## 2019-05-20 DIAGNOSIS — Z331 Pregnant state, incidental: Secondary | ICD-10-CM

## 2019-05-20 DIAGNOSIS — R8271 Bacteriuria: Secondary | ICD-10-CM

## 2019-05-20 DIAGNOSIS — Z3403 Encounter for supervision of normal first pregnancy, third trimester: Secondary | ICD-10-CM

## 2019-05-20 LAB — POCT URINALYSIS DIPSTICK OB
Blood, UA: NEGATIVE
Glucose, UA: NEGATIVE
Ketones, UA: NEGATIVE
Leukocytes, UA: NEGATIVE
Nitrite, UA: NEGATIVE
POC,PROTEIN,UA: NEGATIVE

## 2019-05-23 ENCOUNTER — Inpatient Hospital Stay (HOSPITAL_COMMUNITY): Admission: AD | Admit: 2019-05-23 | Payer: 59 | Source: Home / Self Care

## 2019-05-24 ENCOUNTER — Other Ambulatory Visit: Payer: Self-pay | Admitting: Internal Medicine

## 2019-05-24 ENCOUNTER — Ambulatory Visit (INDEPENDENT_AMBULATORY_CARE_PROVIDER_SITE_OTHER): Payer: 59 | Admitting: Internal Medicine

## 2019-05-24 ENCOUNTER — Other Ambulatory Visit: Payer: Self-pay | Admitting: Advanced Practice Midwife

## 2019-05-24 ENCOUNTER — Encounter: Payer: Self-pay | Admitting: Internal Medicine

## 2019-05-24 ENCOUNTER — Other Ambulatory Visit: Payer: Self-pay

## 2019-05-24 VITALS — BP 125/85 | HR 73 | Wt 151.0 lb

## 2019-05-24 DIAGNOSIS — R8271 Bacteriuria: Secondary | ICD-10-CM

## 2019-05-24 DIAGNOSIS — Z1389 Encounter for screening for other disorder: Secondary | ICD-10-CM

## 2019-05-24 DIAGNOSIS — Z3403 Encounter for supervision of normal first pregnancy, third trimester: Secondary | ICD-10-CM

## 2019-05-24 DIAGNOSIS — Z3A40 40 weeks gestation of pregnancy: Secondary | ICD-10-CM

## 2019-05-24 DIAGNOSIS — Z331 Pregnant state, incidental: Secondary | ICD-10-CM

## 2019-05-24 LAB — POCT URINALYSIS DIPSTICK OB
Blood, UA: NEGATIVE
Glucose, UA: NEGATIVE
Ketones, UA: NEGATIVE
Leukocytes, UA: NEGATIVE
Nitrite, UA: NEGATIVE
POC,PROTEIN,UA: NEGATIVE

## 2019-05-24 NOTE — Patient Instructions (Signed)
Call the office or go to [facility] if:  You begin to have strong, frequent contractions  Your water breaks.  Sometimes it is a big gush of fluid, sometimes it is just a trickle that keeps getting your panties wet or running down your legs  You have vaginal bleeding.  It is normal to have a small amount of spotting if your cervix was checked.   You don't feel your baby moving like normal.  If you don't, get you something to eat and drink and lay down and focus on feeling your baby move.  You should feel at least 10 movements in 2 hours.  If you don't, you should call the office or go to [facility].    Tdap Vaccine  It is recommended that you get the Tdap vaccine during the third trimester of EACH pregnancy to help protect your baby from getting pertussis (whooping cough)  27-36 weeks is the BEST time to do this so that you can pass the protection on to your baby. During pregnancy is better than after pregnancy, but if you are unable to get it during pregnancy it will be offered at the hospital.   You can get this vaccine with us, at the health department, your family doctor, or some local pharmacies  Everyone who will be around your baby should also be up-to-date on their vaccines before the baby comes. Adults (who are not pregnant) only need 1 dose of Tdap during adulthood.   Third Trimester of Pregnancy The third trimester is from week 29 through week 42, months 7 through 9. The third trimester is a time when the fetus is growing rapidly. At the end of the ninth month, the fetus is about 20 inches in length and weighs 6-10 pounds.  BODY CHANGES Your body goes through many changes during pregnancy. The changes vary from woman to woman.   Your weight will continue to increase. You can expect to gain 25-35 pounds (11-16 kg) by the end of the pregnancy.  You may begin to get stretch marks on your hips, abdomen, and breasts.  You may urinate more often because the fetus is  moving lower into your pelvis and pressing on your bladder.  You may develop or continue to have heartburn as a result of your pregnancy.  You may develop constipation because certain hormones are causing the muscles that push waste through your intestines to slow down.  You may develop hemorrhoids or swollen, bulging veins (varicose veins).  You may have pelvic pain because of the weight gain and pregnancy hormones relaxing your joints between the bones in your pelvis. Backaches may result from overexertion of the muscles supporting your posture.  You may have changes in your hair. These can include thickening of your hair, rapid growth, and changes in texture. Some women also have hair loss during or after pregnancy, or hair that feels dry or thin. Your hair will most likely return to normal after your baby is born.  Your breasts will continue to grow and be tender. A yellow discharge may leak from your breasts called colostrum.  Your belly button may stick out.  You may feel short of breath because of your expanding uterus.  You may notice the fetus "dropping," or moving lower in your abdomen.  You may have a bloody mucus discharge. This usually occurs a few days to a week before labor begins.  Your cervix becomes thin and soft (effaced) near your due date. WHAT TO EXPECT AT   YOUR PRENATAL EXAMS  You will have prenatal exams every 2 weeks until week 89. Then, you will have weekly prenatal exams. During a routine prenatal visit:  You will be weighed to make sure you and the fetus are growing normally.  Your blood pressure is taken.  Your abdomen will be measured to track your baby's growth.  The fetal heartbeat will be listened to.  Any test results from the previous visit will be discussed.  You may have a cervical check near your due date to see if you have effaced. At around 36 weeks, your caregiver will check your cervix. At the same time, your caregiver will also perform a  test on the secretions of the vaginal tissue. This test is to determine if a type of bacteria, Group B streptococcus, is present. Your caregiver will explain this further. Your caregiver may ask you:  What your birth plan is.  How you are feeling.  If you are feeling the baby move.  If you have had any abnormal symptoms, such as leaking fluid, bleeding, severe headaches, or abdominal cramping.  If you have any questions. Other tests or screenings that may be performed during your third trimester include:  Blood tests that check for low iron levels (anemia).  Fetal testing to check the health, activity level, and growth of the fetus. Testing is done if you have certain medical conditions or if there are problems during the pregnancy. FALSE LABOR You may feel small, irregular contractions that eventually go away. These are called Braxton Hicks contractions, or false labor. Contractions may last for hours, days, or even weeks before true labor sets in. If contractions come at regular intervals, intensify, or become painful, it is best to be seen by your caregiver.  SIGNS OF LABOR   Menstrual-like cramps.  Contractions that are 5 minutes apart or less.  Contractions that start on the top of the uterus and spread down to the lower abdomen and back.  A sense of increased pelvic pressure or back pain.  A watery or bloody mucus discharge that comes from the vagina. If you have any of these signs before the 37th week of pregnancy, call your caregiver right away. You need to go to the hospital to get checked immediately. HOME CARE INSTRUCTIONS   Avoid all smoking, herbs, alcohol, and unprescribed drugs. These chemicals affect the formation and growth of the baby.  Follow your caregiver's instructions regarding medicine use. There are medicines that are either safe or unsafe to take during pregnancy.  Exercise only as directed by your caregiver. Experiencing uterine cramps is a good sign to  stop exercising.  Continue to eat regular, healthy meals.  Wear a good support bra for breast tenderness.  Do not use hot tubs, steam rooms, or saunas.  Wear your seat belt at all times when driving.  Avoid raw meat, uncooked cheese, cat litter boxes, and soil used by cats. These carry germs that can cause birth defects in the baby.  Take your prenatal vitamins.  Try taking a stool softener (if your caregiver approves) if you develop constipation. Eat more high-fiber foods, such as fresh vegetables or fruit and whole grains. Drink plenty of fluids to keep your urine clear or pale yellow.  Take warm sitz baths to soothe any pain or discomfort caused by hemorrhoids. Use hemorrhoid cream if your caregiver approves.  If you develop varicose veins, wear support hose. Elevate your feet for 15 minutes, 3-4 times a day. Limit salt in your  diet.  Avoid heavy lifting, wear low heal shoes, and practice good posture.  Rest a lot with your legs elevated if you have leg cramps or low back pain.  Visit your dentist if you have not gone during your pregnancy. Use a soft toothbrush to brush your teeth and be gentle when you floss.  A sexual relationship may be continued unless your caregiver directs you otherwise.  Do not travel far distances unless it is absolutely necessary and only with the approval of your caregiver.  Take prenatal classes to understand, practice, and ask questions about the labor and delivery.  Make a trial run to the hospital.  Pack your hospital bag.  Prepare the baby's nursery.  Continue to go to all your prenatal visits as directed by your caregiver. SEEK MEDICAL CARE IF:  You are unsure if you are in labor or if your water has broken.  You have dizziness.  You have mild pelvic cramps, pelvic pressure, or nagging pain in your abdominal area.  You have persistent nausea, vomiting, or diarrhea.  You have a bad smelling vaginal discharge.  You have pain with  urination. SEEK IMMEDIATE MEDICAL CARE IF:   You have a fever.  You are leaking fluid from your vagina.  You have spotting or bleeding from your vagina.  You have severe abdominal cramping or pain.  You have rapid weight loss or gain.  You have shortness of breath with chest pain.  You notice sudden or extreme swelling of your face, hands, ankles, feet, or legs.  You have not felt your baby move in over an hour.  You have severe headaches that do not go away with medicine.  You have vision changes. Document Released: 08/09/2001 Document Revised: 08/20/2013 Document Reviewed: 10/16/2012 [facility] Patient Information 2015 [place], [place]. This information is not intended to replace advice given to you by your health care provider. Make sure you discuss any questions you have with your health care provider.

## 2019-05-24 NOTE — Progress Notes (Signed)
   PRENATAL VISIT NOTE  Subjective:  Gina Galvan is a 28 y.o. G1P0000 at [redacted]w[redacted]d being seen today for ongoing prenatal care.  She is currently monitored for the following issues for this low-risk pregnancy and has Breast nodule; Supervision of normal first pregnancy; and GBS bacteriuria on their problem list.  Patient reports no complaints.  Contractions: Not present.  .  Movement: Present. Denies leaking of fluid.   The following portions of the patient's history were reviewed and updated as appropriate: allergies, current medications, past family history, past medical history, past social history, past surgical history and problem list.   Objective:   Vitals:   05/24/19 1105  BP: 125/85  Pulse: 73  Weight: 151 lb (68.5 kg)    Fetal Status: Fetal Heart Rate (bpm): 120 Fundal Height: 40 cm Movement: Present  Presentation: Vertex  General:  Alert, oriented and cooperative. Patient is in no acute distress.  Skin: Skin is warm and dry. No rash noted.   Cardiovascular: Normal heart rate noted  Respiratory: Normal respiratory effort, no problems with respiration noted  Abdomen: Soft, gravid, appropriate for gestational age.  Pain/Pressure: Present     Pelvic: Cervical exam performed Dilation: 3 Effacement (%): 70 Station: -1  Extremities: Normal range of motion.  Edema: Mild pitting, slight indentation  Mental Status: Normal mood and affect. Normal behavior. Normal judgment and thought content.   NST: FHR baseline 120 bpm, Variability: moderate, Accelerations:present, Decelerations:  Absent= Cat 1 FHT Toco: Occasional irregular ctx with uterine irritability    Assessment and Plan:  Pregnancy: G1P0000 at [redacted]w[redacted]d 1. Encounter for supervision of normal first pregnancy in third trimester Continue routine PNC.  NST reactive today.  Postdates IOL scheduled for [redacted]w[redacted]d. Will need repeat NST prior for antenatal screening.   2. GBS bacteriuria Antibiotic prophylaxis during labor.   3.  Pregnant state, incidental - POC Urinalysis Dipstick OB  4. Screening for genitourinary condition UA negative.  - POC Urinalysis Dipstick OB  Term labor symptoms and general obstetric precautions including but not limited to vaginal bleeding, contractions, leaking of fluid and fetal movement were reviewed in detail with the patient. Please refer to After Visit Summary for other counseling recommendations.   Return in about 4 days (around 05/28/2019) for routine [facility] and NST for postdates .  Future Appointments  Date Time Provider [place]  05/28/2019 10:10 AM Florian Buff, MD [facility]  05/30/2019  6:45 AM [facility] [place] None    Melina Schools, DO

## 2019-05-25 ENCOUNTER — Inpatient Hospital Stay (HOSPITAL_COMMUNITY)
Admission: AD | Admit: 2019-05-25 | Discharge: 2019-05-27 | DRG: 768 | Disposition: A | Discharge status: Home / Self Care | Payer: 59 | Attending: Obstetrics & Gynecology | Admitting: Obstetrics & Gynecology

## 2019-05-25 ENCOUNTER — Encounter (HOSPITAL_COMMUNITY): Payer: Self-pay

## 2019-05-25 ENCOUNTER — Inpatient Hospital Stay (HOSPITAL_COMMUNITY): Payer: 59 | Admitting: Anesthesiology

## 2019-05-25 DIAGNOSIS — Z3A40 40 weeks gestation of pregnancy: Secondary | ICD-10-CM | POA: Diagnosis not present

## 2019-05-25 DIAGNOSIS — O26893 Other specified pregnancy related conditions, third trimester: Secondary | ICD-10-CM | POA: Diagnosis present

## 2019-05-25 DIAGNOSIS — O99824 Streptococcus B carrier state complicating childbirth: Secondary | ICD-10-CM | POA: Diagnosis present

## 2019-05-25 DIAGNOSIS — Z20828 Contact with and (suspected) exposure to other viral communicable diseases: Secondary | ICD-10-CM | POA: Diagnosis present

## 2019-05-25 DIAGNOSIS — R8271 Bacteriuria: Secondary | ICD-10-CM

## 2019-05-25 DIAGNOSIS — O480 Post-term pregnancy: Principal | ICD-10-CM | POA: Diagnosis present

## 2019-05-25 DIAGNOSIS — Z3A41 41 weeks gestation of pregnancy: Secondary | ICD-10-CM | POA: Diagnosis present

## 2019-05-25 LAB — CBC
HCT: 38.5 % (ref 36.0–46.0)
Hemoglobin: 12.7 g/dL (ref 12.0–15.0)
MCH: 28.8 pg (ref 26.0–34.0)
MCHC: 33 g/dL (ref 30.0–36.0)
MCV: 87.3 fL (ref 80.0–100.0)
Platelets: 160 10*3/uL (ref 150–400)
RBC: 4.41 MIL/uL (ref 3.87–5.11)
RDW: 12.7 % (ref 11.5–15.5)
WBC: 10.1 10*3/uL (ref 4.0–10.5)
nRBC: 0 % (ref 0.0–0.2)

## 2019-05-25 LAB — COMPREHENSIVE METABOLIC PANEL
ALT: 12 U/L (ref 0–44)
AST: 24 U/L (ref 15–41)
Albumin: 2.9 g/dL — ABNORMAL LOW (ref 3.5–5.0)
Alkaline Phosphatase: 191 U/L — ABNORMAL HIGH (ref 38–126)
Anion gap: 10 (ref 5–15)
BUN: 11 mg/dL (ref 6–20)
CO2: 19 mmol/L — ABNORMAL LOW (ref 22–32)
Calcium: 9 mg/dL (ref 8.9–10.3)
Chloride: 105 mmol/L (ref 98–111)
Creatinine, Ser: 0.91 mg/dL (ref 0.44–1.00)
GFR calc Af Amer: >60 mL/min (ref >60)
GFR calc non Af Amer: >60 mL/min (ref >60)
Glucose, Bld: 66 mg/dL — ABNORMAL LOW (ref 70–99)
Potassium: 3.8 mmol/L (ref 3.5–5.1)
Sodium: 134 mmol/L — ABNORMAL LOW (ref 135–145)
Total Bilirubin: 0.2 mg/dL — ABNORMAL LOW (ref 0.3–1.2)
Total Protein: 6 g/dL — ABNORMAL LOW (ref 6.5–8.1)

## 2019-05-25 LAB — URINALYSIS, ROUTINE W REFLEX MICROSCOPIC
Bilirubin Urine: NEGATIVE
Glucose, UA: NEGATIVE mg/dL
Hgb urine dipstick: NEGATIVE
Ketones, ur: NEGATIVE mg/dL
Leukocytes,Ua: NEGATIVE
Nitrite: NEGATIVE
Protein, ur: NEGATIVE mg/dL
Specific Gravity, Urine: 1.008 (ref 1.005–1.030)
pH: 6 (ref 5.0–8.0)

## 2019-05-25 LAB — RPR: RPR Ser Ql: NONREACTIVE

## 2019-05-25 LAB — SARS CORONAVIRUS 2 BY RT PCR (HOSPITAL ORDER, PERFORMED IN ~~LOC~~ HOSPITAL LAB): SARS Coronavirus 2: NEGATIVE

## 2019-05-25 LAB — PROTEIN / CREATININE RATIO, URINE
Creatinine, Urine: 49.81 mg/dL
Protein Creatinine Ratio: 0.26 mg/mg{Cre} — ABNORMAL HIGH (ref 0.00–0.15)
Total Protein, Urine: 13 mg/dL

## 2019-05-25 MED ORDER — LIDOCAINE HCL (PF) 1 % IJ SOLN
30.0000 mL | INTRAMUSCULAR | Status: DC | PRN
  Filled 2019-05-25: qty 30

## 2019-05-25 MED ORDER — TERBUTALINE SULFATE 1 MG/ML IJ SOLN: 0.2500 mg | Freq: Once | INTRAMUSCULAR | Status: DC | PRN

## 2019-05-25 MED ORDER — OXYTOCIN 40 UNITS IN NORMAL SALINE INFUSION - SIMPLE MED: 1.0000 m[IU]/min | INTRAVENOUS | Status: DC

## 2019-05-25 MED ORDER — EPHEDRINE 5 MG/ML INJ: 10.0000 mg | INTRAVENOUS | Status: DC | PRN

## 2019-05-25 MED ORDER — ONDANSETRON HCL 4 MG/2ML IJ SOLN
4.0000 mg | Freq: Four times a day (QID) | INTRAMUSCULAR | Status: DC | PRN
  Administered 2019-05-25 – 2019-05-26 (×2): 4 mg via INTRAVENOUS
  Filled 2019-05-25 (×2): qty 2

## 2019-05-25 MED ORDER — OXYTOCIN 40 UNITS IN NORMAL SALINE INFUSION - SIMPLE MED: 2.5000 [IU]/h | INTRAVENOUS | Status: DC

## 2019-05-25 MED ORDER — FENTANYL-BUPIVACAINE-NACL 0.5-0.125-0.9 MG/250ML-% EP SOLN
12.0000 mL/h | EPIDURAL | Status: DC | PRN
  Filled 2019-05-25: qty 250

## 2019-05-25 MED ORDER — LIDOCAINE HCL (PF) 1 % IJ SOLN
INTRAMUSCULAR | Status: DC | PRN
  Administered 2019-05-25: 11 mL via EPIDURAL

## 2019-05-25 MED ORDER — ACETAMINOPHEN 325 MG PO TABS: 650.0000 mg | ORAL_TABLET | ORAL | Status: DC | PRN

## 2019-05-25 MED ORDER — OXYCODONE-ACETAMINOPHEN 5-325 MG PO TABS: 2.0000 | ORAL_TABLET | ORAL | Status: DC | PRN

## 2019-05-25 MED ORDER — OXYTOCIN BOLUS FROM INFUSION: 500.0000 mL | Freq: Once | INTRAVENOUS | Status: DC

## 2019-05-25 MED ORDER — PENICILLIN G 3 MILLION UNITS IVPB - SIMPLE MED: 3.0000 10*6.[IU] | INTRAVENOUS | Status: DC

## 2019-05-25 MED ORDER — PHENYLEPHRINE 40 MCG/ML (10ML) SYRINGE FOR IV PUSH (FOR BLOOD PRESSURE SUPPORT): 80.0000 ug | PREFILLED_SYRINGE | INTRAVENOUS | Status: DC | PRN

## 2019-05-25 MED ORDER — SODIUM CHLORIDE 0.9 % IV SOLN: 5.0000 10*6.[IU] | Freq: Once | INTRAVENOUS | Status: DC

## 2019-05-25 MED ORDER — LIDOCAINE HCL (PF) 1 % IJ SOLN: 30.0000 mL | INTRAMUSCULAR | Status: DC | PRN

## 2019-05-25 MED ORDER — PENICILLIN G 3 MILLION UNITS IVPB - SIMPLE MED
3.0000 10*6.[IU] | INTRAVENOUS | Status: DC
  Administered 2019-05-25 (×4): 3 10*6.[IU] via INTRAVENOUS
  Filled 2019-05-25 (×7): qty 100

## 2019-05-25 MED ORDER — OXYTOCIN 40 UNITS IN NORMAL SALINE INFUSION - SIMPLE MED
1.0000 m[IU]/min | INTRAVENOUS | Status: DC
  Administered 2019-05-25: 2 m[IU]/min via INTRAVENOUS
  Filled 2019-05-25: qty 1000

## 2019-05-25 MED ORDER — ONDANSETRON HCL 4 MG/2ML IJ SOLN: 4.0000 mg | Freq: Four times a day (QID) | INTRAMUSCULAR | Status: DC | PRN

## 2019-05-25 MED ORDER — LACTATED RINGERS IV SOLN: 500.0000 mL | INTRAVENOUS | Status: DC | PRN

## 2019-05-25 MED ORDER — DIPHENHYDRAMINE HCL 50 MG/ML IJ SOLN: 12.5000 mg | INTRAMUSCULAR | Status: DC | PRN

## 2019-05-25 MED ORDER — SOD CITRATE-CITRIC ACID 500-334 MG/5ML PO SOLN: 30.0000 mL | ORAL | Status: DC | PRN

## 2019-05-25 MED ORDER — SODIUM CHLORIDE 0.9 % IV SOLN
5.0000 10*6.[IU] | Freq: Once | INTRAVENOUS | Status: AC
  Administered 2019-05-25: 5 10*6.[IU] via INTRAVENOUS
  Filled 2019-05-25: qty 5

## 2019-05-25 MED ORDER — OXYCODONE-ACETAMINOPHEN 5-325 MG PO TABS: 1.0000 | ORAL_TABLET | ORAL | Status: DC | PRN

## 2019-05-25 MED ORDER — LACTATED RINGERS IV SOLN: INTRAVENOUS | Status: DC

## 2019-05-25 MED ORDER — OXYTOCIN 40 UNITS IN NORMAL SALINE INFUSION - SIMPLE MED
2.5000 [IU]/h | INTRAVENOUS | Status: DC
  Administered 2019-05-26: 2.5 [IU]/h via INTRAVENOUS
  Filled 2019-05-25: qty 1000

## 2019-05-25 MED ORDER — LACTATED RINGERS IV SOLN: 500.0000 mL | Freq: Once | INTRAVENOUS | Status: DC

## 2019-05-25 MED ORDER — LACTATED RINGERS IV SOLN
INTRAVENOUS | Status: DC
  Administered 2019-05-25 (×2): via INTRAVENOUS

## 2019-05-25 MED ORDER — SODIUM CHLORIDE (PF) 0.9 % IJ SOLN
INTRAMUSCULAR | Status: DC | PRN
  Administered 2019-05-25: 12 mL/h via EPIDURAL

## 2019-05-25 MED ORDER — FENTANYL CITRATE (PF) 100 MCG/2ML IJ SOLN
100.0000 ug | INTRAMUSCULAR | Status: DC | PRN
  Administered 2019-05-25 (×2): 100 ug via INTRAVENOUS
  Filled 2019-05-25 (×2): qty 2

## 2019-05-25 NOTE — Progress Notes (Addendum)
Gina Galvan is a 28 y.o. G1P0000 at [redacted]w[redacted]d presented for spontaneous onset of labor  Subjective: Comfortable with epidural.   Objective: BP (!) 136/97   Pulse (!) 102   Temp 98.6 F (37 C) (Oral)   Resp 17   Ht 5\' 2"  (1.575 m)   Wt 68.3 kg   LMP 08/16/2018   SpO2 99%   BMI 27.53 kg/m  No intake/output data recorded. No intake/output data recorded.  FHT:  FHR: 120 bpm, variability: moderate,  accelerations:  Present,  decelerations:  Absent UC:   regular, every 2-5 minutes SVE:   Dilation: 8.5 Effacement (%): 100 Station: 0 Exam by:: [facility] RN  Labs: Lab Results  Component Value Date   WBC 10.1 05/25/2019   HGB 12.7 05/25/2019   HCT 38.5 05/25/2019   MCV 87.3 05/25/2019   PLT 160 05/25/2019    Assessment / Plan: Gina Galvan is a 28 y.o. G1P0000 at [redacted]w[redacted]d presented for spontaneous onset of labor  Labor: Cervix 8.5 since 09:50, Inserted IUPC and started Pitocin 2 by 2. Fetal Wellbeing:  Category I reassuring tracing  Pain Control:  Epidural I/D:  gbs positve on PCN  Anticipated MOD:  NSVD anticipated. c-section if maternal/fetal indication  Lattie Haw MD PGY-1, [place]  05/25/2019, 9:01 PM

## 2019-05-25 NOTE — H&P (Addendum)
OBSTETRIC ADMISSION HISTORY AND PHYSICAL  FAIGY STRETCH is a 28 y.o. female G1P0000 with IUP at [redacted]w[redacted]d by LMP presenting for SOL. Was scheduled to have IOL this week for postdates. Reports good fetal movement. Denies vaginal bleeding or leaking of fluid. Has had some clear discharge. Reports high Bps after 37 weeks but normalized. Has assoc headaches with this. Denies headache currently. Denies visual changes, RUQ/epigastric pain, chest pain, dizziness or SOB.  Would like IUD for contraception. Plans to breast feed.   She received her prenatal care at [facility].  Support person in labor: Kiley Solimine . Anatomy U/S: normal  Prenatal History/Complications: . None  Past Medical History: Past Medical History:  Diagnosis Date  . Breast nodule 07/19/2013   Left nodule at 4 0' clock mobile nontender  . Contraceptive management 07/19/2013  . Depression   . Tachycardia   . Vaginal irritation 06/10/2014  . Yeast infection 06/10/2014    Past Surgical History: Past Surgical History:  Procedure Laterality Date  . TONSILLECTOMY AND ADENOIDECTOMY    . WISDOM TOOTH EXTRACTION      Obstetrical History: OB History    Gravida  1   Para  0   Term  0   Preterm  0   AB  0   Living  0     SAB  0   TAB  0   Ectopic  0   Multiple  0   Live Births              Social History: Social History   Socioeconomic History  . Marital status: Married    Spouse name: Not on file  . Number of children: 0  . Years of education: Not on file  . Highest education level: Not on file  Occupational History  . Not on file  Social Needs  . Financial resource strain: Not on file  . Food insecurity    Worry: Not on file    Inability: Not on file  . Transportation needs    Medical: Not on file    Non-medical: Not on file  Tobacco Use  . Smoking status: Never Smoker  . Smokeless tobacco: Never Used  Substance and Sexual Activity  . Alcohol use: No  . Drug use:  No  . Sexual activity: Yes    Birth control/protection: None  Lifestyle  . Physical activity    Days per week: Not on file    Minutes per session: Not on file  . Stress: Not on file  Relationships  . Social Musician on phone: Not on file    Gets together: Not on file    Attends religious service: Not on file    Active member of club or organization: Not on file    Attends meetings of clubs or organizations: Not on file    Relationship status: Not on file  Other Topics Concern  . Not on file  Social History Narrative  . Not on file    Family History: Family History  Problem Relation Age of Onset  . Hypertension Maternal Grandmother   . Heart attack Paternal Grandfather   . Stroke Paternal Grandmother   . Cancer Brother        thyroid and melonoma    Allergies: Allergies  Allergen Reactions  . Shellfish Allergy Swelling    Medications Prior to Admission  Medication Sig Dispense Refill Last Dose  . Fexofenadine HCl (ALLEGRA PO) Take by mouth as  needed.   05/24/2019 at Unknown time  . Levocetirizine Dihydrochloride (XYZAL PO) Take by mouth at bedtime.   Past Week at Unknown time  . Prenatal Vit-Fe Fumarate-FA (PRENATAL VITAMIN PO) Take by mouth.   05/24/2019 at Unknown time     Review of Systems  All systems reviewed and negative except as stated in HPI  Blood pressure (!) 137/92, pulse 94, height  (1.575 m), weight 68.3 kg, last menstrual period 08/16/2018, SpO2 99 %. General appearance: alert, cooperative and appears stated age Lungs: chest clear, no crackles or wheeze, no respiratory distress Heart: RRR, no rubs or gallops  Abdomen: soft, non-tender; gravid abdomen, bowel sounds present  Pelvic: 4, 90, -1 in AMU Extremities: Homans sign is negative, no sign of DVT Presentation: cephalic Fetal monitoring:125 bpm, moderate variability, accels present, no decels  Uterine activity: irregular  Dilation: 4 Effacement (%): 90 Station: -1 Exam by::  Scientist, product/process development RN  Prenatal labs: ABO, Rh: A/Negative/-- (02/26 1104) Antibody: Negative (07/29 0846) Rubella: 6.57 (02/26 1104) RPR: Non Reactive (07/29 0846)  HBsAg: Negative (02/26 1104)  HIV: Non Reactive (07/29 0846)  GBS:   positive  Glucola: 68/14/101 Genetic screening:   Declined   Prenatal Transfer Tool  Maternal Diabetes: No Genetic Screening: Declined Maternal Ultrasounds/Referrals: Normal Fetal Ultrasounds or other Referrals:  None Maternal Substance Abuse:  No Significant Maternal Medications:  None Significant Maternal Lab Results: Group B Strep positive  Results for orders placed or performed during the hospital encounter of 05/25/19 (from the past 24 hour(s))  SARS Coronavirus 2 [facility] order, Performed in [facility] hospital lab) Nasopharyngeal Nasopharyngeal Swab   Collection Time: 05/25/19  6:47 AM   Specimen: Nasopharyngeal Swab  Result Value Ref Range   SARS Coronavirus 2 NEGATIVE NEGATIVE  CBC   Collection Time: 05/25/19  6:47 AM  Result Value Ref Range   WBC 10.1 4.0 - 10.5 K/uL   RBC 4.41 3.87 - 5.11 MIL/uL   Hemoglobin 12.7 12.0 - 15.0 g/dL   HCT 16.1 09.6 - 04.5 %   MCV 87.3 80.0 - 100.0 fL   MCH 28.8 26.0 - 34.0 pg   MCHC 33.0 30.0 - 36.0 g/dL   RDW 40.9 81.1 - 91.4 %   Platelets 160 150 - 400 K/uL   nRBC 0.0 0.0 - 0.2 %  Comprehensive metabolic panel   Collection Time: 05/25/19  6:47 AM  Result Value Ref Range   Sodium 134 (L) 135 - 145 mmol/L   Potassium 3.8 3.5 - 5.1 mmol/L   Chloride 105 98 - 111 mmol/L   CO2 19 (L) 22 - 32 mmol/L   Glucose, Bld 66 (L) 70 - 99 mg/dL   BUN 11 6 - 20 mg/dL   Creatinine, Ser 7.82 0.44 - 1.00 mg/dL   Calcium 9.0 8.9 - 95.6 mg/dL   Total Protein 6.0 (L) 6.5 - 8.1 g/dL   Albumin 2.9 (L) 3.5 - 5.0 g/dL   AST 24 15 - 41 U/L   ALT 12 0 - 44 U/L   Alkaline Phosphatase 191 (H) 38 - 126 U/L   Total Bilirubin 0.2 (L) 0.3 - 1.2 mg/dL   GFR calc non Af Amer >60 >60 mL/min   GFR calc Af Amer >60 >60 mL/min    Anion gap 10 5 - 15  Protein / creatinine ratio, urine   Collection Time: 05/25/19  6:47 AM  Result Value Ref Range   Creatinine, Urine 49.81 mg/dL   Total Protein, Urine 13 mg/dL  Protein Creatinine Ratio 0.26 (H) 0.00 - 0.15 mg/mg[Cre]  Urinalysis, Routine w reflex microscopic   Collection Time: 05/25/19  6:47 AM  Result Value Ref Range   Color, Urine STRAW (A) YELLOW   APPearance CLEAR CLEAR   Specific Gravity, Urine 1.008 1.005 - 1.030   pH 6.0 5.0 - 8.0   Glucose, UA NEGATIVE NEGATIVE mg/dL   Hgb urine dipstick NEGATIVE NEGATIVE   Bilirubin Urine NEGATIVE NEGATIVE   Ketones, ur NEGATIVE NEGATIVE mg/dL   Protein, ur NEGATIVE NEGATIVE mg/dL   Nitrite NEGATIVE NEGATIVE   Leukocytes,Ua NEGATIVE NEGATIVE  Type and screen [place]   Collection Time: 05/25/19  6:47 AM  Result Value Ref Range   ABO/RH(D) A NEG    Antibody Screen POS    Sample Expiration 05/28/2019,2359    Antibody Identification PASSIVELY ACQUIRED ANTI-D    Unit Number [id number]    Blood Component Type RED CELLS,LR    Unit division 00    Status of Unit ALLOCATED    Transfusion Status OK TO TRANSFUSE    Crossmatch Result COMPATIBLE    Unit Number [id number]    Blood Component Type RED CELLS,LR    Unit division 00    Status of Unit ALLOCATED    Transfusion Status OK TO TRANSFUSE    Crossmatch Result COMPATIBLE   BPAM RBC   Collection Time: 05/25/19  6:47 AM  Result Value Ref Range   Blood Product Unit Number [id number]    PRODUCT CODE G2563S93    Unit Type and Rh 0600    Blood Product Expiration Date 202010212359    ISSUE DATE / TIME [id number]    Blood Product Unit Number [id number]    PRODUCT CODE A4536I68    Unit Type and Rh 0600    Blood Product Expiration Date 032122482500   Results for orders placed or performed in visit on 05/24/19 (from the past 24 hour(s))  POC Urinalysis Dipstick OB   Collection Time: 05/24/19 11:09 AM  Result Value Ref Range   Color,  UA     Clarity, UA     Glucose, UA Negative Negative   Bilirubin, UA     Ketones, UA neg    Spec Grav, UA     Blood, UA neg    pH, UA     POC,PROTEIN,UA Negative Negative, Trace, Small (1+), Moderate (2+), Large (3+), 4+   Urobilinogen, UA     Nitrite, UA neg    Leukocytes, UA Negative Negative   Appearance     Odor      Patient Active Problem List   Diagnosis Date Noted  . GBS bacteriuria 10/31/2018  . Supervision of normal first pregnancy 10/24/2018  . Breast nodule 07/19/2013    Assessment/Plan:  NAEEMA PATLAN is a 28 y.o. G1P0000 at [redacted]w[redacted]d here for SOL.   Labor:  Consider pitocin and AROM. -- pain control: 134mcg fentanyl Q1hr PRN, offered pt epidural but pt desire natural route.  Fetal Wellbeing: EFW 6-7lb by [facility]. Cephalic by cervical exam. -- GBS positive in urine. Prescribed Penicillin  -- continuous fetal monitoring   HTN  Bps 128/97, 139/99, 137/92 --Pre E labs: CMET, CBC --Urine PCR --Monitor closely for severe features   Postpartum Planning -- Plans to breast feed  -- Rhogam: 04/05/19 --Tdap 04/05/19 --Birth control: IUD (OP)  Lattie Haw, MD PGY-1, [facility]     Patient cervix now 6cm. Discussed with patient risks and benefits of AROM for augmentation of labor. Patient  agreeable to plan of care. AROM with moderate amount of clear  Patient and FHR tolerated procedure well. Will continue expectant management per patient request. Patient desires natural labor.   Preeclampsia labs wnl. Will continue to monitor.   Rolm BookbinderCaroline M , CNM 05/25/19 10:00 AM

## 2019-05-25 NOTE — MAU Note (Signed)
Dr. Posey Pronto called back and patient will be admitted to L&D. Orders placed for admission.

## 2019-05-25 NOTE — MAU Note (Signed)
Pt reports contractions that started at 0330, now every 7-10 mins. She denies LOF or vaginal bleeding. Reports good fetal movement. Cervix 3cm yesterday.

## 2019-05-25 NOTE — Anesthesia Procedure Notes (Signed)
Epidural Patient location during procedure: OB Start time: 05/25/2019 3:13 PM End time: 05/25/2019 3:27 PM  Staffing Anesthesiologist: Lynda Rainwater, MD Performed: anesthesiologist   Preanesthetic Checklist Completed: patient identified, site marked, surgical consent, pre-op evaluation, timeout performed, IV checked, risks and benefits discussed and monitors and equipment checked  Epidural Patient position: sitting Prep: ChloraPrep Patient monitoring: heart rate, cardiac monitor, continuous pulse ox and blood pressure Approach: midline Location: L2-L3 Injection technique: LOR saline  Needle:  Needle type: Tuohy  Needle gauge: 17 G Needle length: 9 cm Needle insertion depth: 5 cm Catheter type: closed end flexible Catheter size: 20 Guage Catheter at skin depth: 9 cm Test dose: negative  Assessment Events: blood not aspirated, injection not painful, no injection resistance, negative IV test and no paresthesia  Additional Notes Reason for block:procedure for pain

## 2019-05-25 NOTE — Progress Notes (Signed)
Labor Progress Note Gina Galvan is a 28 y.o. G1P0000 at [redacted]w[redacted]d presented for spontaneous onset of labor  S:  Patient comfortable with epidural  O:  BP (!) 148/99   Pulse 68   Temp 98 F (36.7 C)   Resp 16   Ht 5\' 2"  (1.575 m)   Wt 68.3 kg   LMP 08/16/2018   SpO2 99%   BMI 27.53 kg/m   Fetal Tracing:  Baseline: 120 Variability: moderate Accels: 15x15 Decels: none  Toco: 1-3   CVE: Dilation: 8.5 Effacement (%): 100 Cervical Position: Middle Station: 0 Presentation: Vertex Exam by:: Acey Lav RN   A&P: 28 y.o. G1P0000 [redacted]w[redacted]d spontaneous onset of labor #Labor: Progressing well. Continue expectant management. #Pain: epidural #FWB: Cat 1 #GBS positive  Wende Mott, CNM 6:51 PM

## 2019-05-25 NOTE — Anesthesia Preprocedure Evaluation (Signed)

## 2019-05-25 NOTE — Progress Notes (Signed)
Labor Progress Note Gina Galvan is a 28 y.o. G1P0000 at [redacted]w[redacted]d presented for spontaneous onset of labor  S:  Patient coping well with contractions. Feels like pain is worsening  O:  BP (!) 142/84   Pulse 75   Temp 98 F (36.7 C)   Resp 16   Ht 5\' 2"  (1.575 m)   Wt 68.3 kg   LMP 08/16/2018   SpO2 99%   BMI 27.53 kg/m   Fetal Tracing:  Baseline: 120 Variability: moderate Accels: 15x15 Decels: none  Toco: 1-5   CVE: Dilation: 8.5 Effacement (%): 100 Cervical Position: Middle Station: 0 Presentation: Vertex Exam by:: Lynnda Shields RN   A&P: 28 y.o. G1P0000 [redacted]w[redacted]d spontaneous onset of labor #Labor: Progressing well. Continue expectant management #Pain: per patient request #FWB: Cat 1 #GBS positive   Wende Mott, CNM

## 2019-05-25 NOTE — Progress Notes (Signed)
Dr. Posey Pronto called back after speaking with her fellow and asked for the patient to be rechecked in about an hour then call back with report. Hold off on getting labs at this time; continue to monitor BP during that time.

## 2019-05-26 ENCOUNTER — Encounter (HOSPITAL_COMMUNITY): Payer: Self-pay

## 2019-05-26 DIAGNOSIS — O99824 Streptococcus B carrier state complicating childbirth: Secondary | ICD-10-CM

## 2019-05-26 DIAGNOSIS — O480 Post-term pregnancy: Secondary | ICD-10-CM

## 2019-05-26 DIAGNOSIS — Z3A40 40 weeks gestation of pregnancy: Secondary | ICD-10-CM

## 2019-05-26 LAB — CBC
HCT: 30.3 % — ABNORMAL LOW (ref 36.0–46.0)
HCT: 30.1 % — ABNORMAL LOW (ref 36.0–46.0)
Hemoglobin: 9.9 g/dL — ABNORMAL LOW (ref 12.0–15.0)
Hemoglobin: 10.8 g/dL — ABNORMAL LOW (ref 12.0–15.0)
MCH: 28.9 pg (ref 26.0–34.0)
MCH: 31 pg (ref 26.0–34.0)
MCHC: 32.7 g/dL (ref 30.0–36.0)
MCHC: 35.9 g/dL (ref 30.0–36.0)
MCV: 88.3 fL (ref 80.0–100.0)
MCV: 86.5 fL (ref 80.0–100.0)
Platelets: 156 10*3/uL (ref 150–400)
Platelets: 118 10*3/uL — ABNORMAL LOW (ref 150–400)
RBC: 3.43 MIL/uL — ABNORMAL LOW (ref 3.87–5.11)
RBC: 3.48 MIL/uL — ABNORMAL LOW (ref 3.87–5.11)
RDW: 12.9 % (ref 11.5–15.5)
RDW: 12.6 % (ref 11.5–15.5)
WBC: 13.6 10*3/uL — ABNORMAL HIGH (ref 4.0–10.5)
WBC: 23.8 10*3/uL — ABNORMAL HIGH (ref 4.0–10.5)
nRBC: 0 % (ref 0.0–0.2)
nRBC: 0 % (ref 0.0–0.2)

## 2019-05-26 LAB — DIC (DISSEMINATED INTRAVASCULAR COAGULATION)PANEL
D-Dimer, Quant: 3.34 ug/mL-FEU — ABNORMAL HIGH (ref 0.00–0.50)
Fibrinogen: 423 mg/dL (ref 210–475)
INR: 1.1 (ref 0.8–1.2)
Platelets: 149 10*3/uL — ABNORMAL LOW (ref 150–400)
Prothrombin Time: 13.9 seconds (ref 11.4–15.2)
Smear Review: NONE SEEN
aPTT: 27 seconds (ref 24–36)

## 2019-05-26 LAB — PREPARE RBC (CROSSMATCH)

## 2019-05-26 LAB — POSTPARTUM HEMORRHAGE PROTOCOL (BB NOTIFICATION)

## 2019-05-26 MED ORDER — MISOPROSTOL 200 MCG PO TABS
ORAL_TABLET | ORAL | Status: AC
  Filled 2019-05-26: qty 5

## 2019-05-26 MED ORDER — ONDANSETRON HCL 4 MG PO TABS: 4.0000 mg | ORAL_TABLET | ORAL | Status: DC | PRN

## 2019-05-26 MED ORDER — WITCH HAZEL-GLYCERIN EX PADS: 1.0000 "application " | MEDICATED_PAD | CUTANEOUS | Status: DC | PRN

## 2019-05-26 MED ORDER — TETANUS-DIPHTH-ACELL PERTUSSIS 5-2.5-18.5 LF-MCG/0.5 IM SUSP: 0.5000 mL | Freq: Once | INTRAMUSCULAR | Status: DC

## 2019-05-26 MED ORDER — CARBOPROST TROMETHAMINE 250 MCG/ML IM SOLN
INTRAMUSCULAR | Status: AC
  Administered 2019-05-26: 03:00:00 250 ug
  Filled 2019-05-26: qty 1

## 2019-05-26 MED ORDER — DIPHENHYDRAMINE HCL 25 MG PO CAPS: 25.0000 mg | ORAL_CAPSULE | Freq: Four times a day (QID) | ORAL | Status: DC | PRN

## 2019-05-26 MED ORDER — ACETAMINOPHEN 325 MG PO TABS: 650.0000 mg | ORAL_TABLET | ORAL | Status: DC | PRN

## 2019-05-26 MED ORDER — METHYLERGONOVINE MALEATE 0.2 MG/ML IJ SOLN
INTRAMUSCULAR | Status: AC
  Administered 2019-05-26: 03:00:00 0.2 mg via INTRAMUSCULAR
  Filled 2019-05-26: qty 1

## 2019-05-26 MED ORDER — SODIUM CHLORIDE 0.9 % IV SOLN
100.0000 mg | Freq: Once | INTRAVENOUS | Status: AC
  Administered 2019-05-26: 12:00:00 100 mg via INTRAVENOUS
  Filled 2019-05-26: qty 5

## 2019-05-26 MED ORDER — SODIUM CHLORIDE 0.9% IV SOLUTION
Freq: Once | INTRAVENOUS | Status: AC
  Administered 2019-05-26: 03:00:00 via INTRAVENOUS

## 2019-05-26 MED ORDER — METHYLERGONOVINE MALEATE 0.2 MG/ML IJ SOLN
0.2000 mg | Freq: Once | INTRAMUSCULAR | Status: AC
  Administered 2019-05-26: 03:00:00 0.2 mg via INTRAMUSCULAR

## 2019-05-26 MED ORDER — SIMETHICONE 80 MG PO CHEW: 80.0000 mg | CHEWABLE_TABLET | ORAL | Status: DC | PRN

## 2019-05-26 MED ORDER — LACTATED RINGERS IV BOLUS: 1000.0000 mL | Freq: Once | INTRAVENOUS | Status: DC

## 2019-05-26 MED ORDER — LACTATED RINGERS IV SOLN
INTRAVENOUS | Status: DC
  Administered 2019-05-26: 20:00:00 via INTRAVENOUS

## 2019-05-26 MED ORDER — CEFAZOLIN SODIUM-DEXTROSE 2-4 GM/100ML-% IV SOLN
2.0000 g | Freq: Three times a day (TID) | INTRAVENOUS | Status: AC
  Administered 2019-05-26 – 2019-05-27 (×3): 2 g via INTRAVENOUS
  Filled 2019-05-26 (×3): qty 100

## 2019-05-26 MED ORDER — COCONUT OIL OIL: 1.0000 "application " | TOPICAL_OIL | Status: DC | PRN

## 2019-05-26 MED ORDER — DIBUCAINE (PERIANAL) 1 % EX OINT: 1.0000 "application " | TOPICAL_OINTMENT | CUTANEOUS | Status: DC | PRN

## 2019-05-26 MED ORDER — FERROUS SULFATE 325 (65 FE) MG PO TABS
325.0000 mg | ORAL_TABLET | Freq: Two times a day (BID) | ORAL | Status: DC
  Administered 2019-05-26 – 2019-05-27 (×3): 325 mg via ORAL
  Filled 2019-05-26 (×3): qty 1

## 2019-05-26 MED ORDER — TRANEXAMIC ACID-NACL 1000-0.7 MG/100ML-% IV SOLN
INTRAVENOUS | Status: AC
  Administered 2019-05-26: 03:00:00 1000 mg via INTRAVENOUS
  Filled 2019-05-26: qty 100

## 2019-05-26 MED ORDER — TRANEXAMIC ACID-NACL 1000-0.7 MG/100ML-% IV SOLN
1000.0000 mg | INTRAVENOUS | Status: AC
  Administered 2019-05-26: 03:00:00 1000 mg via INTRAVENOUS

## 2019-05-26 MED ORDER — HYDROMORPHONE HCL 1 MG/ML IJ SOLN
0.5000 mg | Freq: Once | INTRAMUSCULAR | Status: AC
  Administered 2019-05-26: 0.5 mg via INTRAVENOUS
  Filled 2019-05-26: qty 0.5

## 2019-05-26 MED ORDER — DIPHENOXYLATE-ATROPINE 2.5-0.025 MG PO TABS
2.0000 | ORAL_TABLET | Freq: Once | ORAL | Status: AC
  Administered 2019-05-26: 2 via ORAL
  Filled 2019-05-26: qty 2

## 2019-05-26 MED ORDER — ONDANSETRON HCL 4 MG/2ML IJ SOLN
4.0000 mg | INTRAMUSCULAR | Status: DC | PRN
  Administered 2019-05-26: 4 mg via INTRAVENOUS
  Filled 2019-05-26: qty 2

## 2019-05-26 MED ORDER — SENNOSIDES-DOCUSATE SODIUM 8.6-50 MG PO TABS
2.0000 | ORAL_TABLET | ORAL | Status: DC
  Administered 2019-05-26 – 2019-05-27 (×2): 2 via ORAL
  Filled 2019-05-26 (×2): qty 2

## 2019-05-26 MED ORDER — OXYCODONE HCL 5 MG PO TABS: 10.0000 mg | ORAL_TABLET | ORAL | Status: DC | PRN

## 2019-05-26 MED ORDER — BENZOCAINE-MENTHOL 20-0.5 % EX AERO
1.0000 "application " | INHALATION_SPRAY | CUTANEOUS | Status: DC | PRN
  Administered 2019-05-27: 1 via TOPICAL
  Filled 2019-05-26: qty 56

## 2019-05-26 MED ORDER — PRENATAL MULTIVITAMIN CH
1.0000 | ORAL_TABLET | Freq: Every day | ORAL | Status: DC
  Administered 2019-05-26 – 2019-05-27 (×2): 1 via ORAL
  Filled 2019-05-26 (×2): qty 1

## 2019-05-26 MED ORDER — TRANEXAMIC ACID-NACL 1000-0.7 MG/100ML-% IV SOLN
1000.0000 mg | Freq: Once | INTRAVENOUS | Status: DC | PRN
  Filled 2019-05-26: qty 100

## 2019-05-26 MED ORDER — DOCUSATE SODIUM 100 MG PO CAPS
100.0000 mg | ORAL_CAPSULE | Freq: Two times a day (BID) | ORAL | Status: DC
  Administered 2019-05-27 (×2): 100 mg via ORAL
  Filled 2019-05-26 (×2): qty 1

## 2019-05-26 MED ORDER — SODIUM CHLORIDE 0.9 % IV SOLN: 510.0000 mg | Freq: Once | INTRAVENOUS | Status: DC

## 2019-05-26 MED ORDER — MEASLES, MUMPS & RUBELLA VAC IJ SOLR: 0.5000 mL | Freq: Once | INTRAMUSCULAR | Status: DC

## 2019-05-26 MED ORDER — METHYLERGONOVINE MALEATE 0.2 MG/ML IJ SOLN
INTRAMUSCULAR | Status: AC
  Filled 2019-05-26: qty 1

## 2019-05-26 MED ORDER — SODIUM CHLORIDE 0.9% IV SOLUTION: Freq: Once | INTRAVENOUS | Status: AC

## 2019-05-26 MED ORDER — ZOLPIDEM TARTRATE 5 MG PO TABS: 5.0000 mg | ORAL_TABLET | Freq: Every evening | ORAL | Status: DC | PRN

## 2019-05-26 MED ORDER — IBUPROFEN 600 MG PO TABS
600.0000 mg | ORAL_TABLET | Freq: Four times a day (QID) | ORAL | Status: DC
  Administered 2019-05-26 – 2019-05-27 (×3): 600 mg via ORAL
  Filled 2019-05-26 (×3): qty 1

## 2019-05-26 MED ORDER — OXYCODONE HCL 5 MG PO TABS
5.0000 mg | ORAL_TABLET | ORAL | Status: DC | PRN
  Administered 2019-05-26: 5 mg via ORAL
  Filled 2019-05-26: qty 1

## 2019-05-26 MED ORDER — LACTATED RINGERS IV SOLN: INTRAVENOUS | Status: DC

## 2019-05-26 MED ORDER — MISOPROSTOL 200 MCG PO TABS
1000.0000 ug | ORAL_TABLET | Freq: Once | ORAL | Status: AC
  Administered 2019-05-26: 03:00:00 1000 ug via RECTAL

## 2019-05-26 NOTE — Progress Notes (Addendum)
Gina Galvan a 28 y.o.G1P0000 at [redacted]w[redacted]d presented for spontaneous onset of labor  Subjective: Feeling pressure   Objective: BP 125/85   Pulse 81   Temp 99.1 F (37.3 C) (Oral)   Resp 18   Ht 5\' 2"  (1.575 m)   Wt 68.3 kg   LMP 08/16/2018   SpO2 99%   BMI 27.53 kg/m  No intake/output data recorded. Total I/O In: 950 [Other:950] Out: -   FHT:  FHR: 130 bpm, variability: moderate,  accelerations:  Present,  decelerations:  Present early UC:   regular, every 1.5-2 minutes SVE:   Dilation: Lip/rim Effacement (%): 100 Station: Plus 1 Exam by:: [facility]. [facility] RN  Labs: Lab Results  Component Value Date   WBC 10.1 05/25/2019   HGB 12.7 05/25/2019   HCT 38.5 05/25/2019   MCV 87.3 05/25/2019   PLT 160 05/25/2019    Assessment / Plan: Gina Galvan a 28 y.o.G1P0000 at [redacted]w[redacted]d presented for spontaneous onset of labor  Labor: Progressing normally continue current care Fetal Wellbeing:  Category II Pain Control:  Epidural I/D:  gbs positve on PCN  Anticipated MOD:  NSVD anticipated. c-section if maternal/fetal indication  Lattie Haw MD PGY-1, [place] [facility]  05/26/2019, 1:19 AM

## 2019-05-26 NOTE — Discharge Summary (Signed)
OB Discharge Summary     Patient Name: Gina Galvan DOB: 1991/01/12 MRN: [id number]  Date of admission: 05/25/2019 Delivering MD: Jaynie Collins A   Date of discharge: 05/26/2019  Admitting diagnosis: 40 wks ctx Intrauterine pregnancy: [redacted]w[redacted]d     Secondary diagnosis:  Principal Problem:   Postpartum care following vaginal delivery Active Problems:   Post term pregnancy, 41 weeks   Postpartum hemorrhage   Third degree perineal laceration during delivery, 3b   Shoulder dystocia during labor and delivery, delivered  Additional problems: none     Discharge diagnosis: Term Pregnancy Delivered, PPH and shoulder dystocia                                                                                                Post partum procedures:Bakri balloon, blood transfusion  Augmentation: AROM and Pitocin  Complications: Hemorrhage>1066mL  Hospital course:  Onset of Labor With Vaginal Delivery     28 y.o. yo G1P0000 at [redacted]w[redacted]d was admitted in spontaneous labor on 05/25/2019. Patient's labor course as follows:   She was admitted actively contracting and dilated at 6 cm. She AROMed with clear fluid. Her labor was augmented with pitocin. Details of the delivery can be found in the patient's delivery note.  Membrane Rupture Time/Date: 9:50 AM ,05/25/2019   Intrapartum Procedures: Episiotomy: Median [2]                                         Lacerations:  3rd degree [4];Perineal [11]  Patient had a delivery of a Viable infant. 05/26/2019  Information for the patient's newborn:  Shresta, Risden [[id number]]  Delivery Method: Vaginal, Spontaneous(Filed from Delivery Summary)     Patient's postpartum course was complicated by episiotomy, 15 second shoulder dystocia, and PPH ~1800.  She received post placental pitocin, TXA, methergine, rectal cytotec, STAT fluids, 2 units of pRBCs and Bakri balloon.   She is ambulating, tolerating a regular diet, passing flatus, and urinating well.  Patient is discharged home in stable condition on 05/27/19.   Physical exam  Vitals:   05/26/19 0334 05/26/19 0335 05/26/19 0339 05/26/19 0345  BP:  (!) 125/91  (!) 148/111  Pulse:  (!) 116  (!) 101  Resp:  18  18  Temp:      TempSrc:      SpO2: 100%  100%   Weight:      Height:       General: alert Lochia: appropriate Uterine Fundus: firm Incision: N/A DVT Evaluation: No evidence of DVT seen on physical exam. Labs: Lab Results  Component Value Date   WBC 13.6 (H) 05/26/2019   HGB 9.9 (L) 05/26/2019   HCT 30.3 (L) 05/26/2019   MCV 88.3 05/26/2019   PLT 149 (L) 05/26/2019   PLT 156 05/26/2019   CMP Latest Ref Rng & Units 05/25/2019  Glucose 70 - 99 mg/dL 27(X)  BUN 6 - 20 mg/dL 11  Creatinine 4.12 - 8.78 mg/dL 6.76  Sodium 720 - 947 mmol/L 134(L)  Potassium 3.5 - 5.1 mmol/L 3.8  Chloride 98 - 111 mmol/L 105  CO2 22 - 32 mmol/L 19(L)  Calcium 8.9 - 10.3 mg/dL 9.0  Total Protein 6.5 - 8.1 g/dL 6.0(L)  Total Bilirubin 0.3 - 1.2 mg/dL 0.2(L)  Alkaline Phos 38 - 126 U/L 191(H)  AST 15 - 41 U/L 24  ALT 0 - 44 U/L 12    Discharge instruction: per After Visit Summary and "Baby and Me Booklet".  After visit meds: tylenol, IBU, iron, allegra, zyzal   Diet: No evidence of DVT seen on physical exam.  Activity: Advance as tolerated. Pelvic rest for 6 weeks.   Outpatient follow up:4 weeks Follow up Appt: Please schedule this patient for Postpartum visit in: 4 weeks with the following provider: MD For C/S patients schedule nurse incision check in weeks 2 weeks: no Low risk pregnancy complicated by: shoulder dystocia and PPH (EBL ~1800) Delivery mode:  SVD Anticipated Birth Control:  IUD PP Procedures needed: IUD outpatient  Schedule Integrated [place] visit: no  Future Appointments  Date Time Provider [place]  05/28/2019 10:10 AM Florian Buff, MD [facility]   Follow up Visit:No follow-ups on file.  Postpartum contraception: IUD post partum  visit  Newborn Data: Live born female  Birth Weight: 8 lb 4.3 oz (3751 g) APGAR: 8, 9  Newborn Delivery   Birth date/time: 05/26/2019 02:24:00 Delivery type: Vaginal, Spontaneous      Baby Feeding: Breast Disposition:home with mother   05/26/2019 Merilyn Baba, DO

## 2019-05-26 NOTE — Anesthesia Postprocedure Evaluation (Signed)
Anesthesia Post Note  Patient: Gina Galvan  Procedure(s) Performed: AN AD HOC LABOR EPIDURAL     Patient location during evaluation: Mother Baby Anesthesia Type: Epidural Level of consciousness: awake and alert and oriented Pain management: satisfactory to patient Vital Signs Assessment: post-procedure vital signs reviewed and stable Respiratory status: spontaneous breathing and nonlabored ventilation Cardiovascular status: stable Postop Assessment: no headache, no backache, no signs of nausea or vomiting, adequate PO intake, patient able to bend at knees and able to ambulate (patient up walking) Anesthetic complications: no    Last Vitals:  Vitals:   05/26/19 0630 05/26/19 0801  BP: (!) 143/98 131/76  Pulse: 90 84  Resp: 15 16  Temp:    SpO2: 99% 99%    Last Pain:  Vitals:   05/26/19 0753  TempSrc:   PainSc: 0-No pain   Pain Goal: Patients Stated Pain Goal: 3 (05/26/19 7867)                 Willa Rough

## 2019-05-26 NOTE — Lactation Note (Addendum)
This note was copied from a baby's chart. Lactation Consultation Note: P52, Mother has a severe [place], with an ebl of 1815. Mother received 2 units blood.  Infant is 7 hours. Infant has breastfed once and had formula with a syringe once.  Mother very sleepy on arrival to the room. Father at the bedside.  Basic breastfeeding teaching done.   Infant in cradle hold on arriving to the room.  Assist mother with placing infant in football hold for better positioning.  Mothers nipples erect. Mother taught hand expression. Observed large drops of colostrum.   Assist mother with latching infant using an off sided latch technique. Infant observed with dimples checks until deeper latch obtained.  Infant sustained latch for 15 mins. Observed a steady rhythm of suckling and a few swallows. Infant released the breast and was satisfied. Assist mother with placing infant on the alternate breast. Infant latched well with flanged lips. Observed suckling on and off for 10  Mins.   Discussed with Mother  about using a  DEBP for breast stimulation due to [facility]. Suggested that mother pump after feeding.  Staff nurse Jess will set up DEBP for mother at a later time. Mother is very sleepy and needs rest at this time. She will begin pumping and doing breast massage.   Assist mother with hand expression and infant then fed 1 ml of ebm with a spoon.  Assisted infants oral cavity with a glove. Infant does have a high palate.  Advised mother to cue base feed infant . Reviewed STS. Encouraged both parents to do STS. Discussed cluster feeding. Advised to breastfeed infant on cue and at least 8-10 times in 24 hours.  Lactation Brochure given with phone number and LC information at [facility].  Patient Name: Gina Galvan Date: 05/26/2019 Reason for consult: Initial assessment   Maternal Data Has patient been taught Hand Expression?: Yes Does the patient have breastfeeding experience prior to this delivery?:  No  Feeding Feeding Type: Breast Milk  LATCH Score Latch: Grasps breast easily, tongue down, lips flanged, rhythmical sucking.  Audible Swallowing: None  Type of Nipple: Everted at rest and after stimulation  Comfort (Breast/Nipple): Soft / non-tender  Hold (Positioning): Assistance needed to correctly position infant at breast and maintain latch.  LATCH Score: 7  Interventions Interventions: Breast feeding basics reviewed;Assisted with latch;Skin to skin;Hand express;Breast compression;Adjust position;Support pillows;Position options  Lactation Tools Discussed/Used     Consult Status Consult Status: Follow-up Date: 05/27/19 Follow-up type: In-patient    Jess Barters [facility] 05/26/2019, 11:13 AM

## 2019-05-27 LAB — CBC
HCT: 26.1 % — ABNORMAL LOW (ref 36.0–46.0)
Hemoglobin: 9.3 g/dL — ABNORMAL LOW (ref 12.0–15.0)
MCH: 30.8 pg (ref 26.0–34.0)
MCHC: 35.6 g/dL (ref 30.0–36.0)
MCV: 86.4 fL (ref 80.0–100.0)
Platelets: 124 10*3/uL — ABNORMAL LOW (ref 150–400)
RBC: 3.02 MIL/uL — ABNORMAL LOW (ref 3.87–5.11)
RDW: 13.1 % (ref 11.5–15.5)
WBC: 16.1 10*3/uL — ABNORMAL HIGH (ref 4.0–10.5)
nRBC: 0 % (ref 0.0–0.2)

## 2019-05-27 MED ORDER — FERROUS SULFATE 325 (65 FE) MG PO TABS: 325.0000 mg | ORAL_TABLET | Freq: Two times a day (BID) | ORAL | 3 refills | Status: DC

## 2019-05-27 MED ORDER — INFLUENZA VAC SPLIT QUAD 0.5 ML IM SUSY: 0.5000 mL | PREFILLED_SYRINGE | INTRAMUSCULAR | Status: DC

## 2019-05-27 MED ORDER — IBUPROFEN 600 MG PO TABS: 600.0000 mg | ORAL_TABLET | Freq: Four times a day (QID) | ORAL | 0 refills | Status: DC

## 2019-05-27 MED ORDER — RHO D IMMUNE GLOBULIN 1500 UNIT/2ML IJ SOSY
300.0000 ug | PREFILLED_SYRINGE | Freq: Once | INTRAMUSCULAR | Status: AC
  Administered 2019-05-27: 300 ug via INTRAVENOUS
  Filled 2019-05-27: qty 2

## 2019-05-27 NOTE — Plan of Care (Signed)
Patient had Bakri Balloon removed and bleeding has been fine.Fole was also discontinued and she has voided 1900 cc.

## 2019-05-27 NOTE — Progress Notes (Signed)
CSW received consult for hx of Depression.  CSW met with MOB to offer support and complete assessment.    CSW met with MOB at bedside to discuss consult for history of depression, FOB present. CSW asked FOB to leave the room to speak with MOB privately, FOB left voluntarily. CSW introduced self and explained reason for consult. MOB was breast feeding infant and appeared attached and bonded. MOB was welcoming, pleasant and engaged during assessment. MOB and CSW discussed MOB's mental health history. 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 MOB reported that the depression was situational and that she hasn't experienced symptoms in 2-3 years. MOB reported that her depression was also seasonal and worse in the winter months. CSW inquired about how MOB was feeling emotionally, MOB reported that she feels great after giving birth. MOB denied any other mental health history. MOB reported that she has all items needed to care for infant including a car seat and basinet. CSW inquired about MOB's support system, MOB reported that her mom, sister, dad and in-laws are her supports. MOB reported that she has lots of support. CSW positively affirmed MOB's large support system. MOB presented calm and did not demonstrate any acute mental health signs/symptoms. CSW assessed for safety, MOB denied SI, HI and domestic violence.   CSW provided education regarding the baby blues period vs. perinatal mood disorders, discussed treatment and gave resources for mental health follow up if concerns arise.  CSW recommends self-evaluation during the postpartum time period using the New Mom Checklist from Postpartum Progress and encouraged MOB to contact a medical professional if symptoms are noted at any time.    CSW provided review of Sudden Infant Death Syndrome (SIDS) precautions.    CSW identifies no further need for intervention and no barriers to discharge at this time.  Abundio Miu, [place] Worker [facility] Cell#: [phone]

## 2019-05-27 NOTE — Discharge Instructions (Signed)

## 2019-05-27 NOTE — Lactation Note (Signed)
This note was copied from a baby's chart. Lactation Consultation Note  Patient Name: Gina Galvan Date: 05/27/2019 Reason for consult: Follow-up assessment Baby is 31 hours/4% weight loss.  Mom reports that baby is feeding well.  She is using cross cradle hold and baby feeds for 30-45 minutes and is content after feeding.  Mom has no questions or concerns.  Discussed cluster feeding.  Encouraged to feed with cues and call for assist prn.  Maternal Data Formula Feeding for Exclusion: No  Feeding Feeding Type: Breast Fed  LATCH Score                   Interventions    Lactation Tools Discussed/Used     Consult Status Consult Status: Follow-up Date: 05/28/19 Follow-up type: In-patient    Ave Filter 05/27/2019, 9:25 AM

## 2019-05-27 NOTE — Progress Notes (Signed)
Faculty Attending Note  Post Partum Day 1  Subjective: Patient is feeling well. She reports well controlled pain on PO pain meds. She is ambulating and denies light-headedness or dizziness. She is passing flatus. She is tolerating a regular diet without nausea/vomiting. Bleeding is moderate. She is breast feeding. Baby is in room and doing well.  Objective: Blood pressure 126/80, pulse 68, temperature 98.4 F (36.9 C), temperature source Oral, resp. rate 15, height 5\' 2"  (1.575 m), weight 68.3 kg, last menstrual period 08/16/2018, SpO2 97 %, unknown if currently breastfeeding. Temp:  [98.2 F (36.8 C)-98.6 F (37 C)] 98.4 F (36.9 C) (09/28 0500) Pulse Rate:  [68-87] 68 (09/28 0500) Resp:  [15-18] 15 (09/28 0500) BP: (116-138)/(65-90) 126/80 (09/28 0500) SpO2:  [97 %-100 %] 97 % (09/28 0500)  Physical Exam:  General: alert, oriented, cooperative Chest:  normal respiratory effort Heart: RRR  Abdomen:  soft, appropriately tender to palpation  Uterine Fundus: firm, 2 fingers below the umbilicus Lochia: moderate, rubra DVT Evaluation: no evidence of DVT Extremities: no edema, no calf tenderness  UOP: voiding spontaneously  Recent Labs    05/26/19 0812 05/27/19 0610  HGB 10.8* 9.3*  HCT 30.1* 26.1*    Assessment/Plan: Patient Active Problem List   Diagnosis Date Noted  . Postpartum hemorrhage 05/26/2019  . Third degree perineal laceration during delivery, 3b 05/26/2019  . Shoulder dystocia during labor and delivery, delivered 05/26/2019  . Postpartum care following vaginal delivery 05/25/2019  . Post term pregnancy, 41 weeks 05/25/2019  . GBS bacteriuria 10/31/2018  . Supervision of normal first pregnancy 10/24/2018  . Breast nodule 07/19/2013    Patient is 28 y.o. G1P1001 PPD#1 s/p SVD at [redacted]w[redacted]d c/b [place] with EBL 1800 mL, s/p 2 units PRBCs and bakri balloon. Bakri balloon removed last evening, she has had moderate bleeding since. She is otherwise doing well, recovering  appropriately. Would like to be discharged today if possible.  Continue routine post partum care Pain meds prn Regular diet IUD outpatient for birth control Plan for discharge possibly later today   K. Arvilla Meres, M.D. Attending Center for [employer] (Faculty Practice)  05/27/2019, 7:48 AM

## 2019-05-28 ENCOUNTER — Other Ambulatory Visit: Payer: 59 | Admitting: Obstetrics & Gynecology

## 2019-05-28 LAB — TYPE AND SCREEN
ABO/RH(D): A NEG
Antibody Screen: POSITIVE
Unit division: 0
Unit division: 0
Unit division: 0
Unit division: 0

## 2019-05-28 LAB — RH IG WORKUP (INCLUDES ABO/RH)
ABO/RH(D): A NEG
Fetal Screen: NEGATIVE
Gestational Age(Wks): 40
Unit division: 0

## 2019-05-28 LAB — BPAM RBC
Blood Product Expiration Date: 202010212359
Blood Product Expiration Date: 202010212359
Blood Product Expiration Date: 202010232359
Blood Product Expiration Date: 202010232359
ISSUE DATE / TIME: 202009270259
ISSUE DATE / TIME: 202009270259
Unit Type and Rh: 600
Unit Type and Rh: 600
Unit Type and Rh: 600
Unit Type and Rh: 600

## 2019-05-30 ENCOUNTER — Inpatient Hospital Stay (HOSPITAL_COMMUNITY): Payer: 59

## 2019-07-01 ENCOUNTER — Encounter: Payer: Self-pay | Admitting: Advanced Practice Midwife

## 2019-07-01 ENCOUNTER — Other Ambulatory Visit: Payer: Self-pay

## 2019-07-01 ENCOUNTER — Ambulatory Visit (INDEPENDENT_AMBULATORY_CARE_PROVIDER_SITE_OTHER): Payer: 59 | Admitting: Advanced Practice Midwife

## 2019-07-01 LAB — POCT HEMOGLOBIN: Hemoglobin: 11.7 g/dL (ref 11–14.6)

## 2019-07-01 NOTE — Patient Instructions (Signed)

## 2019-07-01 NOTE — Progress Notes (Signed)
Gina Galvan is a 28 y.o. who presents for a postpartum visit. She is 5 weeks postpartum following a spontaneous vaginal delivery. I have fully reviewed the prenatal and intrapartum course. The delivery was at 40.3 gestational weeks. She had a normal labor.  Delivery complicated by 30 sec shoulder dystocia w/o sequela, 3rd degree lac and PPH (meds/Bakri). Anesthesia: epidural. Postpartum course has been uneventful. Baby's course has been uneventful. Baby is feeding by breast. Bleeding: staining only. Bowel function is normal. Bladder function is normal. Patient is not sexually active. Contraception method is abstinence. Postpartum depression screening: negative.   Current Outpatient Medications:  .  [place] PO, Take by mouth., Disp: , Rfl:   Review of Systems   Constitutional: Negative for fever and chills Eyes: Negative for visual disturbances Respiratory: Negative for shortness of breath, dyspnea Cardiovascular: Negative for chest pain or palpitations  Gastrointestinal: Negative for vomiting, diarrhea and constipation Genitourinary: Negative for dysuria and urgency Musculoskeletal: Negative for back pain, joint pain, myalgias  Neurological: Negative for dizziness and headaches    Objective:     Vitals:   07/01/19 1025  BP: 113/75  Pulse: 77   General:  alert, cooperative and no distress   Breasts:  negative  Lungs: Normal respiratory effort  Heart:  regular rate and rhythm  Abdomen: Soft, nontender   Vulva:  normal  Vagina: normal vagina.  Well healed  Cervix:  closed  Corpus: Well involuted     Rectal Exam: no hemorrhoids        Assessment:    normal postpartum exam.  Plan:   1. Contraception: IUD 2. Follow up in: 1 week or so (no sex until then ( for [place] IUD  or as needed.

## 2019-07-04 ENCOUNTER — Ambulatory Visit (INDEPENDENT_AMBULATORY_CARE_PROVIDER_SITE_OTHER): Payer: 59 | Admitting: Advanced Practice Midwife

## 2019-07-04 ENCOUNTER — Other Ambulatory Visit: Payer: Self-pay

## 2019-07-04 ENCOUNTER — Encounter: Payer: Self-pay | Admitting: Advanced Practice Midwife

## 2019-07-04 VITALS — BP 108/70 | HR 69 | Ht 62.0 in | Wt 129.0 lb

## 2019-07-04 DIAGNOSIS — Z3202 Encounter for pregnancy test, result negative: Secondary | ICD-10-CM

## 2019-07-04 DIAGNOSIS — Z30430 Encounter for insertion of intrauterine contraceptive device: Secondary | ICD-10-CM | POA: Diagnosis not present

## 2019-07-04 LAB — POCT URINE PREGNANCY: Preg Test, Ur: NEGATIVE

## 2019-07-04 MED ORDER — LEVONORGESTREL 19.5 MCG/DAY IU IUD
INTRAUTERINE_SYSTEM | Freq: Once | INTRAUTERINE | Status: AC
  Administered 2019-07-04: 14:00:00 via INTRAUTERINE

## 2019-07-04 NOTE — Patient Instructions (Signed)
Intrauterine Device Insertion, Care After  This sheet gives you information about how to care for yourself after your procedure. Your health care provider may also give you more specific instructions. If you have problems or questions, contact your health care provider. What can I expect after the procedure? After the procedure, it is common to have:  Cramps and pain in the abdomen.  Light bleeding (spotting) or heavier bleeding that is like your menstrual period. This may last for up to a few days.  Lower back pain.  Dizziness.  Headaches.  Nausea. Follow these instructions at home:  Before resuming sexual activity, check to make sure that you can feel the IUD string(s). You should be able to feel the end of the string(s) below the opening of your cervix. If your IUD string is in place, you may resume sexual activity. ? If you had a hormonal IUD inserted more than 7 days after your most recent period started, you will need to use a backup method of birth control for 7 days after IUD insertion. Ask your health care provider whether this applies to you.  Continue to check that the IUD is still in place by feeling for the string(s) after every menstrual period, or once a month.  Take over-the-counter and prescription medicines only as told by your health care provider.  Do not drive or use heavy machinery while taking prescription pain medicine.  Keep all follow-up visits as told by your health care provider. This is important. Contact a health care provider if:  You have bleeding that is heavier or lasts longer than a normal menstrual cycle.  You have a fever.  You have cramps or abdominal pain that get worse or do not get better with medicine.  You develop abdominal pain that is new or is not in the same area of earlier cramping and pain.  You feel lightheaded or weak.  You have abnormal or bad-smelling discharge from your vagina.  You have pain during sexual activity.   You have any of the following problems with your IUD string(s): ? The string bothers or hurts you or your sexual partner. ? You cannot feel the string. ? The string has gotten longer.  You can feel the IUD in your vagina.  You think you may be pregnant, or you miss your menstrual period.  You think you may have an STI (sexually transmitted infection). Get help right away if:  You have flu-like symptoms.  You have a fever and chills.  You can feel that your IUD has slipped out of place. Summary  After the procedure, it is common to have cramps and pain in the abdomen. It is also common to have light bleeding (spotting) or heavier bleeding that is like your menstrual period.  Continue to check that the IUD is still in place by feeling for the string(s) after every menstrual period, or once a month.  Keep all follow-up visits as told by your health care provider. This is important.  Contact your health care provider if you have problems with your IUD string(s), such as the string getting longer or bothering you or your sexual partner. This information is not intended to replace advice given to you by your health care provider. Make sure you discuss any questions you have with your health care provider. Document Released: 04/13/2011 Document Revised: 07/28/2017 Document Reviewed: 07/06/2016 Elsevier Patient Education  2020 Elsevier Inc.  

## 2019-07-04 NOTE — Progress Notes (Signed)
Gina Galvan is a 28 y.o. year old  female  who presents for placement of a Liletta IUD. She is breastfeeding, has not had unprotected intercourse since delivery and her pregnancy test today is negative.    The risks and benefits of the method and placement have been thouroughly reviewed with the patient and all questions were answered.  Specifically the patient is aware of failure rate of 08/998, expulsion of the IUD and of possible perforation.  The patient is aware of irregular bleeding due to the method and understands the incidence of irregular bleeding diminishes with time.  Time out was performed.  A Graves speculum was placed.  The cervix was prepped using Betadine. The uterus was found to be neutral and it sounded to 7 cm.  The cervix was grasped with a tenaculum and the IUD was inserted to 7 cm.  It was pulled back 1 cm and the IUD was disengaged.  The strings were trimmed to 3 cm.  Sonogram was performed and the proper placement of the IUD was verified.  The patient was instructed on signs and symptoms of infection and to check for the strings after each menses or each month.  The patient is to refrain from intercourse for 3 days.  The patient is scheduled for a return appointment after her first menses or 4 weeks.  Joaquim Lai Cresenzo-Dish[place] 07/04/2019 1:46 PM

## 2019-08-01 ENCOUNTER — Encounter: Payer: Self-pay | Admitting: Advanced Practice Midwife

## 2019-08-01 ENCOUNTER — Ambulatory Visit (INDEPENDENT_AMBULATORY_CARE_PROVIDER_SITE_OTHER): Payer: 59 | Admitting: Advanced Practice Midwife

## 2019-08-01 ENCOUNTER — Other Ambulatory Visit: Payer: Self-pay

## 2019-08-01 VITALS — BP 109/73 | HR 73 | Ht 62.0 in | Wt 129.0 lb

## 2019-08-01 DIAGNOSIS — Z30431 Encounter for routine checking of intrauterine contraceptive device: Secondary | ICD-10-CM

## 2019-08-01 NOTE — Progress Notes (Signed)
History:  28 y.o. G1P1001 here today for today for IUD string check; Liletta IUD was placed  07/04/19. No complaints about the IUD, no concerning side effects. Has bled more than she wants to but it's tapering off. Strings are long and need trimming. Not bothered by husband  The following portions of the patient's history were reviewed and updated as appropriate: allergies, current medications, past family history, past medical history, past social history, past surgical history and problem list.  Review of Systems:   Constitutional: Negative for fever and chills Eyes: Negative for visual disturbances Respiratory: Negative for shortness of breath, dyspnea Cardiovascular: Negative for chest pain or palpitations  Gastrointestinal: Negative for vomiting, diarrhea and constipation Genitourinary: Negative for dysuria and urgency Musculoskeletal: Negative for back pain, joint pain, myalgias  Neurological: Negative for dizziness and headaches    Objective:  Physical Exam currently breastfeeding. Gen: NAD Abd: Soft, nontender and nondistended Pelvic: Bedside US reveals properly place IUD. Strings trimmed, no blood right now  Assessment & Plan:  Normal IUD check. Patient to keep IUD in place for 6 years; can come in for removal if she desires pregnancy.

## 2022-02-25 ENCOUNTER — Other Ambulatory Visit: Payer: 59

## 2022-02-25 ENCOUNTER — Telehealth: Payer: 59 | Admitting: Physician Assistant

## 2022-02-25 DIAGNOSIS — B3731 Acute candidiasis of vulva and vagina: Secondary | ICD-10-CM

## 2022-02-25 MED ORDER — FLUCONAZOLE 150 MG PO TABS: 150.0000 mg | ORAL_TABLET | ORAL | 0 refills | Status: DC | PRN

## 2022-02-25 NOTE — Progress Notes (Signed)

## 2022-04-18 ENCOUNTER — Other Ambulatory Visit (HOSPITAL_COMMUNITY)
Admission: RE | Admit: 2022-04-18 | Discharge: 2022-04-18 | Disposition: A | Discharge status: Home / Self Care | Payer: 59 | Source: Ambulatory Visit | Attending: Advanced Practice Midwife | Admitting: Advanced Practice Midwife

## 2022-04-18 ENCOUNTER — Encounter: Payer: Self-pay | Admitting: Advanced Practice Midwife

## 2022-04-18 ENCOUNTER — Ambulatory Visit (INDEPENDENT_AMBULATORY_CARE_PROVIDER_SITE_OTHER): Payer: 59 | Admitting: Advanced Practice Midwife

## 2022-04-18 VITALS — BP 120/77 | HR 67 | Ht 62.0 in | Wt 125.5 lb

## 2022-04-18 DIAGNOSIS — Z124 Encounter for screening for malignant neoplasm of cervix: Secondary | ICD-10-CM

## 2022-04-18 DIAGNOSIS — R102 Pelvic and perineal pain: Secondary | ICD-10-CM | POA: Insufficient documentation

## 2022-04-18 DIAGNOSIS — Z975 Presence of (intrauterine) contraceptive device: Secondary | ICD-10-CM | POA: Insufficient documentation

## 2022-04-18 LAB — POCT URINE PREGNANCY: Preg Test, Ur: NEGATIVE

## 2022-04-18 NOTE — Progress Notes (Signed)
[facility] Visit  Patient name: Gina Galvan MRN [id number]  Date of birth: [birth year]  CC & HPI:  Gina Galvan is a 31 y.o.  female presenting today for pelvic pain for several months. Has a Liletta IUD, placed 11/20.  Pain has happened several times over the last few months, feels like it is "getting worse".  Feels "just like contractions:, felt in lower abdomen, back and tops of legs. Has had once instance of dyspareunia. Has taken several UPTs, all negative. Feels like IUD strings have gotten shorter.  Last pap 06/16/17, normal.  As a side note, since birth of baby in 2020, often feels like she has to void despite just having voided.  Sometimes she pees a large amount, other times just dribbles.    Pertinent History Reviewed:  Medical & Surgical Hx:   Past Medical History:  Diagnosis Date   Breast nodule 07/19/2013   Left nodule at 4 0' clock mobile nontender   Contraceptive management 07/19/2013   Depression    Tachycardia    Vaginal irritation 06/10/2014   Yeast infection 06/10/2014   Past Surgical History:  Procedure Laterality Date   TONSILLECTOMY AND ADENOIDECTOMY     WISDOM TOOTH EXTRACTION     Family History  Problem Relation Age of Onset   Hypertension Maternal Grandmother    Heart attack Paternal Grandfather    Stroke Paternal Grandmother    Cancer Brother        thyroid and melonoma    Current Outpatient Medications:    levonorgestrel (LILETTA, 52 MG,) 19.5 MCG/DAY IUD IUD, 1 each by Intrauterine route once., Disp: , Rfl:  Social History: Reviewed -  reports that she has never smoked. She has never used smokeless tobacco.  Review of Systems:   Constitutional: Negative for fever and chills Eyes: Negative for visual disturbances Respiratory: Negative for shortness of breath, dyspnea Cardiovascular: Negative for chest pain or palpitations  Gastrointestinal: Negative for vomiting, diarrhea and constipation; no abdominal pain Genitourinary:  Negative for dysuria and urgency, vaginal irritation or itching Musculoskeletal: Negative for back pain, joint pain, myalgias  Neurological: Negative for dizziness and headaches    Objective Findings:    Physical Examination: Vitals:   04/18/22 1021  BP: 120/77  Pulse: 67   General appearance - well appearing, and in no distress Mental status - alert, oriented to person, place, and time Chest:  Normal respiratory effort Heart - normal rate and regular rhythm Abdomen:  Soft, nontender Pelvic: SSE:  Normal appearing DC, cx slightly friable.NuSwab and  pap collected Musculoskeletal:  Normal range of motion without pain Extremities:  No edema    No results found for this or any previous visit (from the past 24 hour(s)).    Assessment & Plan:  A:   Pelvic pain P:  R/O infection; if negative,    Return for pelvic US w/amber then provider visit.   Offered a Pelvic floor PT referral--lives in [place], so [place] way to go--will let me know if she wants referral.  Jacklyn Shell CNM 04/18/2022 11:05 AM

## 2022-04-19 LAB — CERVICOVAGINAL ANCILLARY ONLY
Bacterial Vaginitis (gardnerella): POSITIVE — AB
Candida Glabrata: NEGATIVE
Candida Vaginitis: NEGATIVE
Chlamydia: NEGATIVE
Comment: NEGATIVE
Comment: NEGATIVE
Comment: NEGATIVE
Comment: NEGATIVE
Comment: NEGATIVE
Comment: NORMAL
Neisseria Gonorrhea: NEGATIVE
Trichomonas: NEGATIVE

## 2022-04-21 ENCOUNTER — Other Ambulatory Visit: Payer: Self-pay | Admitting: Advanced Practice Midwife

## 2022-04-21 MED ORDER — METRONIDAZOLE 500 MG PO TABS: 500.0000 mg | ORAL_TABLET | Freq: Two times a day (BID) | ORAL | 0 refills | Status: DC

## 2022-04-21 NOTE — Progress Notes (Signed)
Flagyl for BV on swab, pelvic pain

## 2022-04-22 LAB — CYTOLOGY - PAP
Comment: NEGATIVE
Diagnosis: NEGATIVE
High risk HPV: NEGATIVE

## 2022-05-17 ENCOUNTER — Other Ambulatory Visit: Payer: 59

## 2022-05-17 ENCOUNTER — Ambulatory Visit: Payer: 59 | Admitting: Obstetrics & Gynecology

## 2022-06-07 ENCOUNTER — Ambulatory Visit (INDEPENDENT_AMBULATORY_CARE_PROVIDER_SITE_OTHER): Payer: 59 | Admitting: Obstetrics & Gynecology

## 2022-06-07 ENCOUNTER — Ambulatory Visit (INDEPENDENT_AMBULATORY_CARE_PROVIDER_SITE_OTHER): Payer: 59

## 2022-06-07 ENCOUNTER — Encounter: Payer: Self-pay | Admitting: Obstetrics & Gynecology

## 2022-06-07 VITALS — BP 109/73 | HR 59 | Ht 63.0 in | Wt 125.2 lb

## 2022-06-07 DIAGNOSIS — Z975 Presence of (intrauterine) contraceptive device: Secondary | ICD-10-CM | POA: Diagnosis not present

## 2022-06-07 DIAGNOSIS — R102 Pelvic and perineal pain: Secondary | ICD-10-CM

## 2022-06-07 DIAGNOSIS — Z3009 Encounter for other general counseling and advice on contraception: Secondary | ICD-10-CM

## 2022-06-07 NOTE — Progress Notes (Signed)
PELVIC US TA/TV: homogeneous anteverted uterus,WNL,IUD is centrally located within the endometrium,EEC 2 mm,normal left ovary, unilocular hemorrhagic right ovarian cyst 2.4 x 1.9 x 1.8 cm,no free fluid,no pain during ultrasound,ovaries appear mobile  Chaperone Tish

## 2022-06-07 NOTE — Progress Notes (Signed)
   GYN VISIT Patient name: Gina Galvan MRN [id number]  Date of birth: 08-18-1991 Chief Complaint:   Follow-up (Pelvic pain started several months ago )  History of Present Illness:   Gina Galvan is a 31 y.o. G61P1001 female being seen today for the following concerns:  -Previously noted pelvic pain- seemed to be cyclical.  Noted pain a few times after BV treatment, but since then pain has now resolved.  She denies irregular discharge, itching or irritation.  No other acute complaints.  Reviewed today's [place]- homogeneous anteverted uterus,WNL,IUD is centrally located within the endometrium,EEC 2 mm,normal left ovary, unilocular hemorrhagic right ovarian cyst 2.4 x 1.9 x 1.8 cm,no free fluid,no pain during ultrasound,ovaries appear mobile  No LMP recorded. (Menstrual status: IUD).     10/24/2018   10:15 AM 06/16/2017   11:18 AM  Depression screen PHQ 2/9  Decreased Interest 0 0  Down, Depressed, Hopeless 0 0  PHQ - 2 Score 0 0  Altered sleeping 0   Tired, decreased energy 1   Change in appetite 0   Feeling bad or failure about yourself  0   Trouble concentrating 0   Moving slowly or fidgety/restless 0   Suicidal thoughts 0   PHQ-9 Score 1   Difficult doing work/chores Somewhat difficult      Review of Systems:   Pertinent items are noted in HPI Denies fever/chills, dizziness, headaches, visual disturbances, fatigue, shortness of breath, chest pain, abdominal pain, vomiting, no problems with periods, bowel movements, urination, or intercourse unless otherwise stated above.  Pertinent History Reviewed:  Reviewed past medical,surgical, social, obstetrical and family history.  Reviewed problem list, medications and allergies. Physical Assessment:   Vitals:   06/07/22 1007  BP: 109/73  Pulse: (!) 59  Weight: 125 lb 4 oz (56.8 kg)  Height: 5\' 3"  (1.6 m)  Body mass index is 22.19 kg/m.       Physical Examination:   General appearance: alert, well appearing, and in no  distress  Psych: mood appropriate, normal affect  Skin: warm & dry   Cardiovascular: normal heart rate noted  Respiratory: normal respiratory effort, no distress  Abdomen: soft, non-tender   Pelvic: normal external genitalia, vulva, vagina, cervix, uterus and adnexa, examination not indicated  Extremities: no edema   Chaperone: N/A    Assessment & Plan:  1) Pelvic pain -no acute abnormalities noted and seems to have resolved s/p BV treatment -no further management indicated  2) Family planning -pt notes h/o shoulder dystocia, PPH and 3rd degree lac- she joked that labor was "the worst" and would prefer not to experience any of it ever again; however, she does desire another pregnancy -reviewed potential options including primary C-section and/or scheduled IOL -reviewed that unfortunately cannot predict recurrence though there is a strong possibility -questions/concerns were addressed including discussing long term contraceptive option such as tubal ligation at the time of C-section -once ready advised for removal of IUD to start actively trying -PNV daily   Return in about 1 year (around 06/08/2023) for annual August.   Janyth Pupa, DO Attending [place], [place] for [employer], [place]

## 2023-02-05 ENCOUNTER — Encounter: Payer: Self-pay | Admitting: Advanced Practice Midwife

## 2023-02-06 ENCOUNTER — Other Ambulatory Visit: Payer: Self-pay | Admitting: Obstetrics & Gynecology

## 2023-02-06 MED ORDER — FLUCONAZOLE 150 MG PO TABS: ORAL_TABLET | ORAL | 2 refills | Status: DC

## 2023-02-06 NOTE — Progress Notes (Signed)
Rx for yeast. 

## 2023-03-06 ENCOUNTER — Other Ambulatory Visit: Payer: Self-pay

## 2023-03-06 NOTE — Progress Notes (Signed)
Updated medications and medical history

## 2023-03-07 ENCOUNTER — Ambulatory Visit: Payer: 59 | Attending: Internal Medicine | Admitting: Internal Medicine

## 2023-03-07 ENCOUNTER — Encounter: Payer: Self-pay | Admitting: Internal Medicine

## 2023-03-07 VITALS — BP 118/80 | HR 64 | Ht 63.0 in | Wt 128.2 lb

## 2023-03-07 DIAGNOSIS — I4729 Other ventricular tachycardia: Secondary | ICD-10-CM | POA: Diagnosis not present

## 2023-03-07 DIAGNOSIS — R0602 Shortness of breath: Secondary | ICD-10-CM | POA: Diagnosis not present

## 2023-03-07 DIAGNOSIS — R42 Dizziness and giddiness: Secondary | ICD-10-CM

## 2023-03-07 MED ORDER — METOPROLOL TARTRATE 25 MG PO TABS: 12.5000 mg | ORAL_TABLET | Freq: Two times a day (BID) | ORAL | 3 refills | Status: DC

## 2023-03-07 NOTE — Progress Notes (Signed)
Cardiology Office Note  Date: 03/07/2023   ID: Gina Galvan, DOB August 27, 1991, MRN [id number]  PCP:  Gina Flavin, MD  Cardiologist:  None Electrophysiologist:  None   Reason for Office Visit: Evaluation of dizziness   History of Present Illness: Gina Galvan is a 32 y.o. female with no PMH was referred to cardiology clinic for evaluation of palpitations and dizziness.  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  She started to take high intensity interval training exercises since May 2024 and has been noticing palpitations and dizziness lasting for a few minutes prompting her to stop exercise.  She underwent event monitor in 6/24 that showed 1 run of NSVT lasting 9 beats but otherwise pretty unremarkable.  No family history of sudden cardiac death or any arrhythmias.  She had history of heart attacks in the family.  No chest pains.  No syncope, leg swelling.  Past Medical History:  Diagnosis Date   Breast nodule 07/19/2013   Left nodule at 4 0' clock mobile nontender   Contraceptive management 07/19/2013   Depression    Dizziness    Tachycardia    Vaginal irritation 06/10/2014   Yeast infection 06/10/2014    Past Surgical History:  Procedure Laterality Date   TONSILLECTOMY AND ADENOIDECTOMY     WISDOM TOOTH EXTRACTION      Current Outpatient Medications  Medication Sig Dispense Refill   albuterol (VENTOLIN HFA) 108 (90 Base) MCG/ACT inhaler Inhale 1-2 puffs into the lungs every 4 (four) hours as needed for shortness of breath.     Cholecalciferol (VITAMIN D3) 50 MCG (2000 UT) capsule Take 2,000 Units by mouth daily.     fexofenadine (ALLEGRA ALLERGY) 180 MG tablet Take 180 mg by mouth daily.     levonorgestrel (LILETTA, 52 MG,) 19.5 MCG/DAY IUD IUD 1 each by Intrauterine route once.     metoprolol tartrate (LOPRESSOR) 25 MG tablet Take 0.5 tablets (12.5 mg total) by mouth 2 (two) times daily. 180 tablet 3   montelukast (SINGULAIR) 10 MG tablet Take  10 mg by mouth at bedtime.     multivitamin (CENTRUM) chewable tablet Chew 1 tablet by mouth daily.     TURMERIC PO Take 1 tablet by mouth daily.     No current facility-administered medications for this visit.   Allergies:  Shellfish allergy   Social History: The patient  reports that she has never smoked. She has never used smokeless tobacco. She reports that she does not drink alcohol and does not use drugs.   Family History: The patient's family history includes Cancer in her brother; Heart attack in her paternal grandfather; Hypertension in her maternal grandmother; Stroke in her paternal grandmother.   ROS:  Please see the history of present illness. Otherwise, complete review of systems is positive for none  All other systems are reviewed and negative.   Physical Exam: VS:  BP 118/80   Pulse 64   Ht 5\' 3"  (1.6 m)   Wt 128 lb 3.2 oz (58.2 kg)   SpO2 99%   BMI 22.71 kg/m , BMI Body mass index is 22.71 kg/m.  Wt Readings from Last 3 Encounters:  03/07/23 128 lb 3.2 oz (58.2 kg)  06/07/22 125 lb 4 oz (56.8 kg)  04/18/22 125 lb 8 oz (56.9 kg)    General: Patient appears comfortable at rest. HEENT: Conjunctiva and lids normal, oropharynx clear with moist mucosa. Neck: Supple, no elevated JVP or carotid bruits, no thyromegaly.  Lungs: Clear to auscultation, nonlabored breathing at rest. Cardiac: Regular rate and rhythm, no S3 or significant systolic murmur, no pericardial rub. Abdomen: Soft, nontender, no hepatomegaly, bowel sounds present, no guarding or rebound. Extremities: No pitting edema, distal pulses 2+. Skin: Warm and dry. Musculoskeletal: No kyphosis. Neuropsychiatric: Alert and oriented x3, affect grossly appropriate.  Recent Labwork: No results found for requested labs within last 365 days.  No results found for: "CHOL", "TRIG", "HDL", "CHOLHDL", "VLDL", "LDLCALC", "LDLDIRECT"   Assessment and Plan:  # Nonsustained ventricular tachycardia -Patient had  palpitations and dizziness during high intensity interval training which lasted for a few minutes. She underwent event monitor in 6/24 that showed 1 run of NSVT lasting 9 beats with a maximum rate of 188 bpm.  No PAC or PVC burden.  Self-reported history of V. tach in kindergarten. Will obtain treadmill EKG to rule out any ventricular arrhythmias with exercise. Metoprolol tartrate 12.5 mg twice daily will be started after the stress test (due to NSVT episode).  EKG during the clinic showed NSR, no evidence of preexcitation and normal QTc interval.   I have spent a total of 30 minutes with patient reviewing chart, EKGs, labs and examining patient as well as establishing an assessment and plan that was discussed with the patient.  > 50% of time was spent in direct patient care.    Medication Adjustments/Labs and Tests Ordered: Current medicines are reviewed at length with the patient today.  Concerns regarding medicines are outlined above.   Tests Ordered: Orders Placed This Encounter  Procedures   EXERCISE TOLERANCE TEST (ETT)   EKG 12-Lead    Medication Changes: Meds ordered this encounter  Medications   metoprolol tartrate (LOPRESSOR) 25 MG tablet    Sig: Take 0.5 tablets (12.5 mg total) by mouth 2 (two) times daily.    Dispense:  180 tablet    Refill:  3    03/06/2023-New    Disposition:  Follow up  1 year  Signed Trula Frede Verne Spurr, MD, 03/07/2023 10:10 AM    [facility] [address] [place], [place], [place] [address]

## 2023-03-07 NOTE — Patient Instructions (Addendum)
Medication Instructions:  Your physician has recommended you make the following change in your medication:  Metoprolol tartrate 12.5 mg twice a day after Exercise Stress Test Continue all other medications the same as prescribed  Labwork: None  Testing/Procedures: Your physician has requested that you have an exercise tolerance test. For further information please visit [email or web address] Please also follow instruction sheet, as given.   Follow-Up: Your physician recommends that you schedule a follow-up appointment in: 1 year. You will receive a reminder call in the mail in about 8 months reminding you to call and schedule your appointment. If you don't receive this call, please contact our office.   Any Other Special Instructions Will Be Listed Below (If Applicable).  If you need a refill on your cardiac medications before your next appointment, please call your pharmacy.

## 2023-03-21 ENCOUNTER — Ambulatory Visit (HOSPITAL_COMMUNITY)
Admission: RE | Admit: 2023-03-21 | Discharge: 2023-03-21 | Disposition: A | Discharge status: Home / Self Care | Payer: 59 | Source: Ambulatory Visit | Attending: Family Medicine | Admitting: Family Medicine

## 2023-03-21 DIAGNOSIS — R0602 Shortness of breath: Secondary | ICD-10-CM | POA: Diagnosis not present

## 2023-03-21 LAB — EXERCISE TOLERANCE TEST
Angina Index: 0
Duke Treadmill Score: 11
Estimated workload: 13.4
Exercise duration (min): 10 min
Exercise duration (sec): 52 s
MPHR: 189 {beats}/min
Peak HR: 184 {beats}/min
Percent HR: 97 %
RPE: 13
Rest HR: 70 {beats}/min
ST Depression (mm): 0 mm

## 2023-03-31 ENCOUNTER — Telehealth: Payer: Self-pay

## 2023-03-31 MED ORDER — METOPROLOL TARTRATE 25 MG PO TABS: 12.5000 mg | ORAL_TABLET | Freq: Two times a day (BID) | ORAL | Status: AC | PRN

## 2023-03-31 NOTE — Telephone Encounter (Signed)
Results discussed with patient, she will switch bid dosing of lopressor to prn, pcp copied

## 2023-03-31 NOTE — Telephone Encounter (Signed)
-----   Message from Vishnu P Mallipeddi sent at 03/31/2023  1:04 PM EDT ----- Can switch frequency of metoprolol from daily to as needed for palpitations. ----- Message ----- From: Roseanne Reno, CMA Sent: 03/31/2023  12:45 PM EDT To: Marjo Bicker, MD  Patient notified and verbalized understanding. Patient is asking to come off of Metoprolol as she is not having palpitations frequent enough to continue medication. Please advise.

## 2023-04-02 ENCOUNTER — Encounter: Payer: Self-pay | Admitting: Internal Medicine

## 2024-02-29 ENCOUNTER — Ambulatory Visit: Admitting: Internal Medicine

## 2024-07-03 ENCOUNTER — Telehealth: Payer: Self-pay | Admitting: Internal Medicine

## 2024-07-03 NOTE — Telephone Encounter (Signed)
 Called pt to sch 1 yr f/u with Dr.Mallipeddi. pt said she does not need f/u as she is not having any problems she will speak with her PCP if anything happens again!
